# Patient Record
Sex: Female | Born: 1970 | Race: White | Hispanic: No | Smoking: Former smoker
Health system: Southern US, Community
[De-identification: ages and names within clinical notes are randomized; demographics above are authoritative.]

## PROBLEM LIST (undated history)

## (undated) DIAGNOSIS — Q0700 Arnold-Chiari syndrome without spina bifida or hydrocephalus: Secondary | ICD-10-CM

## (undated) DIAGNOSIS — M797 Fibromyalgia: Secondary | ICD-10-CM

## (undated) DIAGNOSIS — E348 Other specified endocrine disorders: Secondary | ICD-10-CM

## (undated) HISTORY — DX: Fibromyalgia: M79.7

## (undated) HISTORY — PX: INTRAUTERINE DEVICE INSERTION: SHX323

## (undated) HISTORY — DX: Other specified endocrine disorders: E34.8

## (undated) HISTORY — DX: Arnold-Chiari syndrome without spina bifida or hydrocephalus: Q07.00

---

## 1997-09-16 ENCOUNTER — Other Ambulatory Visit: Admission: RE | Admit: 1997-09-16 | Discharge: 1997-09-16 | Payer: Self-pay | Admitting: *Deleted

## 1998-01-14 DIAGNOSIS — Q0700 Arnold-Chiari syndrome without spina bifida or hydrocephalus: Secondary | ICD-10-CM

## 1998-01-14 HISTORY — DX: Arnold-Chiari syndrome without spina bifida or hydrocephalus: Q07.00

## 1998-09-01 ENCOUNTER — Encounter (HOSPITAL_COMMUNITY): Admission: RE | Admit: 1998-09-01 | Discharge: 1998-11-30 | Payer: Self-pay | Admitting: Neurology

## 1998-12-04 ENCOUNTER — Encounter: Admission: RE | Admit: 1998-12-04 | Discharge: 1998-12-04 | Payer: Self-pay | Admitting: Family Medicine

## 1998-12-04 ENCOUNTER — Encounter: Payer: Self-pay | Admitting: Family Medicine

## 1998-12-15 ENCOUNTER — Encounter: Admission: RE | Admit: 1998-12-15 | Discharge: 1998-12-15 | Payer: Self-pay | Admitting: Family Medicine

## 1998-12-15 ENCOUNTER — Encounter: Payer: Self-pay | Admitting: Family Medicine

## 1999-01-15 DIAGNOSIS — E348 Other specified endocrine disorders: Secondary | ICD-10-CM

## 1999-01-15 HISTORY — DX: Other specified endocrine disorders: E34.8

## 1999-08-07 ENCOUNTER — Encounter: Payer: Self-pay | Admitting: Family Medicine

## 1999-08-07 ENCOUNTER — Encounter: Admission: RE | Admit: 1999-08-07 | Discharge: 1999-08-07 | Payer: Self-pay | Admitting: Family Medicine

## 1999-12-21 ENCOUNTER — Emergency Department (HOSPITAL_COMMUNITY): Admission: EM | Admit: 1999-12-21 | Discharge: 1999-12-21 | Payer: Self-pay

## 2000-02-07 ENCOUNTER — Other Ambulatory Visit: Admission: RE | Admit: 2000-02-07 | Discharge: 2000-02-07 | Payer: Self-pay | Admitting: *Deleted

## 2001-03-17 ENCOUNTER — Encounter: Admission: RE | Admit: 2001-03-17 | Discharge: 2001-03-17 | Payer: Self-pay | Admitting: Family Medicine

## 2001-03-17 ENCOUNTER — Encounter: Payer: Self-pay | Admitting: Family Medicine

## 2001-04-02 ENCOUNTER — Encounter: Payer: Self-pay | Admitting: Family Medicine

## 2001-04-02 ENCOUNTER — Encounter (INDEPENDENT_AMBULATORY_CARE_PROVIDER_SITE_OTHER): Payer: Self-pay | Admitting: *Deleted

## 2001-04-02 ENCOUNTER — Encounter: Admission: RE | Admit: 2001-04-02 | Discharge: 2001-04-02 | Payer: Self-pay | Admitting: Family Medicine

## 2001-04-13 ENCOUNTER — Encounter: Admission: RE | Admit: 2001-04-13 | Discharge: 2001-06-05 | Payer: Self-pay | Admitting: Family Medicine

## 2003-07-30 ENCOUNTER — Encounter: Payer: Self-pay | Admitting: Internal Medicine

## 2003-09-07 ENCOUNTER — Other Ambulatory Visit: Admission: RE | Admit: 2003-09-07 | Discharge: 2003-09-07 | Payer: Self-pay | Admitting: Obstetrics and Gynecology

## 2003-10-15 HISTORY — PX: CHOLECYSTECTOMY, LAPAROSCOPIC: SHX56

## 2003-10-28 ENCOUNTER — Ambulatory Visit (HOSPITAL_COMMUNITY): Admission: RE | Admit: 2003-10-28 | Discharge: 2003-10-28 | Payer: Self-pay | Admitting: Surgery

## 2003-10-28 ENCOUNTER — Encounter (INDEPENDENT_AMBULATORY_CARE_PROVIDER_SITE_OTHER): Payer: Self-pay | Admitting: *Deleted

## 2003-11-16 ENCOUNTER — Encounter (INDEPENDENT_AMBULATORY_CARE_PROVIDER_SITE_OTHER): Payer: Self-pay | Admitting: *Deleted

## 2003-11-16 ENCOUNTER — Ambulatory Visit (HOSPITAL_COMMUNITY): Admission: RE | Admit: 2003-11-16 | Discharge: 2003-11-16 | Payer: Self-pay | Admitting: Gastroenterology

## 2004-09-18 ENCOUNTER — Other Ambulatory Visit: Admission: RE | Admit: 2004-09-18 | Discharge: 2004-09-18 | Payer: Self-pay | Admitting: Obstetrics and Gynecology

## 2005-01-30 ENCOUNTER — Encounter (INDEPENDENT_AMBULATORY_CARE_PROVIDER_SITE_OTHER): Payer: Self-pay | Admitting: *Deleted

## 2005-01-30 ENCOUNTER — Encounter: Admission: RE | Admit: 2005-01-30 | Discharge: 2005-01-30 | Payer: Self-pay | Admitting: Family Medicine

## 2006-01-14 DIAGNOSIS — M797 Fibromyalgia: Secondary | ICD-10-CM

## 2006-01-14 HISTORY — DX: Fibromyalgia: M79.7

## 2007-01-16 ENCOUNTER — Ambulatory Visit (HOSPITAL_COMMUNITY): Admission: RE | Admit: 2007-01-16 | Discharge: 2007-01-16 | Payer: Self-pay | Admitting: Psychiatry

## 2007-04-09 ENCOUNTER — Other Ambulatory Visit: Admission: RE | Admit: 2007-04-09 | Discharge: 2007-04-09 | Payer: Self-pay | Admitting: Obstetrics and Gynecology

## 2008-03-01 ENCOUNTER — Encounter: Payer: Self-pay | Admitting: Internal Medicine

## 2008-03-09 ENCOUNTER — Encounter: Payer: Self-pay | Admitting: Internal Medicine

## 2008-04-12 ENCOUNTER — Other Ambulatory Visit: Admission: RE | Admit: 2008-04-12 | Discharge: 2008-04-12 | Payer: Self-pay | Admitting: Obstetrics & Gynecology

## 2008-07-25 DIAGNOSIS — R109 Unspecified abdominal pain: Secondary | ICD-10-CM | POA: Insufficient documentation

## 2008-07-25 DIAGNOSIS — Z8719 Personal history of other diseases of the digestive system: Secondary | ICD-10-CM

## 2008-07-25 DIAGNOSIS — R197 Diarrhea, unspecified: Secondary | ICD-10-CM | POA: Insufficient documentation

## 2008-07-25 DIAGNOSIS — R110 Nausea: Secondary | ICD-10-CM | POA: Insufficient documentation

## 2008-07-25 DIAGNOSIS — K649 Unspecified hemorrhoids: Secondary | ICD-10-CM | POA: Insufficient documentation

## 2008-07-25 DIAGNOSIS — M2550 Pain in unspecified joint: Secondary | ICD-10-CM

## 2008-07-25 DIAGNOSIS — E0590 Thyrotoxicosis, unspecified without thyrotoxic crisis or storm: Secondary | ICD-10-CM | POA: Insufficient documentation

## 2008-07-28 ENCOUNTER — Ambulatory Visit: Payer: Self-pay | Admitting: Internal Medicine

## 2008-07-28 DIAGNOSIS — Q054 Unspecified spina bifida with hydrocephalus: Secondary | ICD-10-CM | POA: Insufficient documentation

## 2008-07-28 DIAGNOSIS — R51 Headache: Secondary | ICD-10-CM

## 2008-07-28 DIAGNOSIS — R519 Headache, unspecified: Secondary | ICD-10-CM | POA: Insufficient documentation

## 2008-07-29 ENCOUNTER — Ambulatory Visit: Payer: Self-pay | Admitting: Internal Medicine

## 2008-07-29 ENCOUNTER — Encounter: Payer: Self-pay | Admitting: Internal Medicine

## 2008-08-01 LAB — CONVERTED CEMR LAB: Tissue Transglutaminase Ab, IgA: 0.1 units (ref <7)

## 2008-08-02 ENCOUNTER — Encounter: Payer: Self-pay | Admitting: Internal Medicine

## 2008-08-03 ENCOUNTER — Encounter: Payer: Self-pay | Admitting: Internal Medicine

## 2008-08-05 ENCOUNTER — Telehealth: Payer: Self-pay | Admitting: Internal Medicine

## 2008-09-05 ENCOUNTER — Ambulatory Visit: Payer: Self-pay | Admitting: Internal Medicine

## 2009-06-08 ENCOUNTER — Telehealth (INDEPENDENT_AMBULATORY_CARE_PROVIDER_SITE_OTHER): Payer: Self-pay | Admitting: *Deleted

## 2009-11-14 HISTORY — PX: KNEE ARTHROSCOPY: SHX127

## 2010-02-04 ENCOUNTER — Encounter: Payer: Self-pay | Admitting: Family Medicine

## 2010-02-13 NOTE — Progress Notes (Signed)
  Phone Note Other Incoming   Request: Send information Summary of Call: Request for records received from  [facility] [place]. Attorneys. Request forwarded to [employer].

## 2010-04-24 ENCOUNTER — Other Ambulatory Visit: Payer: Self-pay | Admitting: Internal Medicine

## 2010-04-24 MED ORDER — CILIDINIUM-CHLORDIAZEPOXIDE 2.5-5 MG PO CAPS: 1.0000 | ORAL_CAPSULE | Freq: Three times a day (TID) | ORAL | Status: AC

## 2010-04-24 NOTE — Telephone Encounter (Signed)
OK, refill and OV

## 2010-04-24 NOTE — Telephone Encounter (Signed)
Dr Juanda Chance, patient last seen 08/2008. She has not gotten librax filled since 11/2008. Do you want me to give a refill and have her come for f/u office visit next available?

## 2010-06-01 NOTE — Op Note (Signed)
NAME:  Isabel Thomas, Isabel Thomas NO.:  [id number]   MEDICAL RECORD NO.:  [id number]          PATIENT TYPE:  AMB   LOCATION:  ENDO                         FACILITY:  [facility]   PHYSICIAN:  Graylin Shiver, M.D.   DATE OF BIRTH:  08/13/1970   DATE OF PROCEDURE:  11/16/2003  DATE OF DISCHARGE:                                 OPERATIVE REPORT   PROCEDURE:  Endoscopic retrograde cholangiogram with sphincterotomy and  stone extraction from the common bile duct.   INDICATIONS FOR PROCEDURE:  Patient is a 40 year old female status post  laparoscopic cholecystectomy recently.  She had an intraoperative  cholangiogram which, when reviewed with the radiologist, was suggestive of  common bile duct stones.   The procedure was explained in detail along with potential risks of  bleeding, infection, and pancreatitis.   PREMEDICATION:  Fentanyl 125 mcg IV, Versed 12 mg IV.   PROCEDURE:  With the patient in the left lateral position, the Olympus  lateral-viewing duodenoscope was inserted into the oropharynx and passed  into the esophagus.  It was advanced down the esophagus, then into the  stomach, then into the duodenum.  The patient was placed on her abdomen.  The papilla of Vater was localized.  It looked normal.  Selective  cannulation of the common bile duct was achieved using a guidewire and  sphincterotome.  Contrast was injected into the biliary tree.  There were a  couple of filling defects noted in the common bile duct, consistent with  stones.  Radiographs were obtained.  The sphincterotome was then properly  localized, and a sphincterotomy was performed.  There was no bleeding.  The  sphincterotome was then removed, leaving the guidewire in place in the  biliary tree.  An 8.5 mm balloon was inserted over the guidewire and  advanced over the common bile duct up into the common hepatic duct area.  The duct was swept.  Two small-to-moderate-sized stones were extracted from  the  common bile duct.  After this, an occlusion cholangiogram was done with  the balloon inflated.  Further inspection of the biliary tree did not reveal  any further stones.  The instruments were removed.  She tolerated the  procedure well without complications.  She was returned to the endoscopy  unit for observation and then discharged.   IMPRESSION:  Choledocholithiasis, status post endoscopic retrograde  cholangiopancreatography, sphincterotomy, and stone extraction from the  common bile duct.      SFG/MEDQ  D:  11/16/2003  T:  11/16/2003  Job:  [id number]   cc:   Sandria Bales. Ezzard Standing, M.D.  [address]  [place]  [place] [address]  Fax: [phone]   Marjory Lies, M.D.  [address]  [place]  [place] [address]  Fax: [phone]

## 2010-06-01 NOTE — Op Note (Signed)
NAME:  Isabel Thomas, Isabel Thomas NO.:  [id number]   MEDICAL RECORD NO.:  [id number]          PATIENT TYPE:  AMB   LOCATION:  DAY                          FACILITY:  [facility]   PHYSICIAN:  Sandria Bales. Ezzard Standing, M.D.  DATE OF BIRTH:  10/30/1970   DATE OF PROCEDURE:  10/28/2003  DATE OF DISCHARGE:                                 OPERATIVE REPORT   PREOPERATIVE DIAGNOSIS:  Chronic cholecystitis, cholelithiasis.   POSTOPERATIVE DIAGNOSIS:  Chronic cholecystitis, cholelithiasis.   PROCEDURE:  Laparoscopic cholecystectomy with intraoperative cholangiogram.   SURGEON:  Dr. Ezzard Standing.   FIRST ASSISTANT:  No first assistant.   ANESTHESIA:  General endotracheal.   INDICATIONS FOR PROCEDURE:  [name] is a 40 year old white female who  has had symptomatic cholelithiasis. Has an ultrasound showing multiple  gallstones and now comes for attempted laparoscopic cholecystectomy. The  indications and potential complications explained to the patient. Potential  complications include but limited to bleeding, infection, bile duct injury,  and open surgery.   OPERATIVE NOTE:  Under general anesthesia, she was given 1 g of Ancef  initial to this procedure. Abdomen was prepped with Betadine solution and  sterilely draped.   An infraumbilical incision was made with sharp dissection and carried down  to the abdominal cavity. I used Applied trocars. I used a Hasson trocar at  the umbilicus. I insufflated the abdomen. The right and left lobe of the  liver were unremarkable. Anterior wall of the stomach was unremarkable. The  bowel was pretty much covered with omentum, but there was no bowel injury or  nodularity within the abdominal cavity.   I then placed a 10-mm xyphoid trocar, a 5-mm right mid subcostal, and a 5-mm  lateral subcostal trocar. The gallbladder was grasped, rotated cephalad. The  patient had a fair amount of adhesions and scar tissue around her  gallbladder. Cystic duct junction  identified and anterior/posterior branch  of the cystic artery and clipped both of these twice. I then identified the  cystic duct, placed a clip on the gallbladder side of the cystic duct and  shot an intraoperative cholangiogram.   Intraoperative cholangiogram was obtained using a cut-off Taut catheter  inserted through a 14-gauge Gelco catheter. The Gelco catheter was sutured  with an Endoclip, and then I used about 6 cc of half-strength Hypaque  solution and injected this into the common bile duct which emptied into the  duodenum without any evidence of obstruction or mass. The contrast refluxed  both limbs of the common bile duct without any blockage or obstruction.   It was felt to be a normal intraoperative cholangiogram. The Taut catheter  was then removed. The cystic duct was triply clipped and divided. I then  sharply dissected the gallbladder from the gallbladder bed. She had 1 or 2  more vessels posterior that were either bovied or clipped. After removing  the entire gallbladder, I placed a EndoCatch bag. I inspected the  gallbladder bed. There was no bleeding or bile leak at either the triangle  of Calot or at the gallbladder bed. The gallbladder was then removed through  the umbilicus and sent to pathology.  I did give a couple of stones to the  patient. The trocars were then removed. The umbilical port was closed with a  0 Vicryl suture. There was no bleeding any trocar site. Skin entry site was  closed with a 5-0 Monocryl suture. Each wound was painted with Benzoin and  steri stripped.   The patient tolerated the procedure well and was transported to the recovery  room in good condition. If she does well, she may go home today.      Davi   DHN/MEDQ  D:  10/28/2003  T:  10/28/2003  Job:  [id number]

## 2011-04-18 LAB — HM PAP SMEAR: HM Pap smear: NEGATIVE

## 2012-01-30 ENCOUNTER — Other Ambulatory Visit: Payer: Self-pay | Admitting: Obstetrics and Gynecology

## 2012-01-30 DIAGNOSIS — Z1231 Encounter for screening mammogram for malignant neoplasm of breast: Secondary | ICD-10-CM

## 2012-02-25 ENCOUNTER — Ambulatory Visit: Payer: Self-pay

## 2012-03-17 ENCOUNTER — Ambulatory Visit
Admission: RE | Admit: 2012-03-17 | Discharge: 2012-03-17 | Disposition: A | Discharge status: Home / Self Care | Payer: BC Managed Care – PPO | Source: Ambulatory Visit | Attending: Obstetrics and Gynecology | Admitting: Obstetrics and Gynecology

## 2012-03-17 ENCOUNTER — Other Ambulatory Visit: Payer: Self-pay | Admitting: Obstetrics and Gynecology

## 2012-03-17 DIAGNOSIS — Z1231 Encounter for screening mammogram for malignant neoplasm of breast: Secondary | ICD-10-CM

## 2012-04-20 ENCOUNTER — Ambulatory Visit (INDEPENDENT_AMBULATORY_CARE_PROVIDER_SITE_OTHER): Payer: BC Managed Care – PPO | Admitting: Nurse Practitioner

## 2012-04-20 ENCOUNTER — Encounter: Payer: Self-pay | Admitting: Nurse Practitioner

## 2012-04-20 VITALS — BP 98/68 | HR 70 | Resp 16 | Ht 59.5 in | Wt 109.0 lb

## 2012-04-20 DIAGNOSIS — Z01419 Encounter for gynecological examination (general) (routine) without abnormal findings: Secondary | ICD-10-CM

## 2012-04-20 DIAGNOSIS — Z0000 Encounter for general adult medical examination without abnormal findings: Secondary | ICD-10-CM

## 2012-04-20 DIAGNOSIS — N951 Menopausal and female climacteric states: Secondary | ICD-10-CM

## 2012-04-20 LAB — POCT URINALYSIS DIPSTICK
Bilirubin, UA: NEGATIVE
Glucose, UA: NEGATIVE
Nitrite, UA: NEGATIVE

## 2012-04-20 MED ORDER — DESOGESTREL-ETHINYL ESTRADIOL 0.15-0.02/0.01 MG (21/5) PO TABS: 1.0000 | ORAL_TABLET | Freq: Every day | ORAL | Status: DC

## 2012-04-20 NOTE — Progress Notes (Signed)
42 y.o. G0P0 Married Caucasian Fe here for annual exam.   Her father in law passed on Christmas day with MI.   He ws 68. Menses recent cycles are normal   Patient's last menstrual period was 03/24/2012.          Sexually active: yes  The current method of family planning is vasectomy. Exercising: no  The patient does not participate in regular exercise at present. Smoker:  Yes ( still 5 per day)  Health Maintenance: Pap:  04/18/11 normal with neg HR HPV MMG:  3/14 normal Colonoscopy:  NA BMD:   NA TDaP:  None?, will check with PCP Labs: UA and HGB   reports that she has been smoking.  She has never used smokeless tobacco. She reports that she does not drink alcohol or use illicit drugs.  Past Medical History  Diagnosis Date  . Arnold-Chiari syndrome 2000    cause of Headaches  . Fibromyalgia syndrome 2008    Past Surgical History  Procedure Laterality Date  . Knee arthroscopy Right 11/11    with lateral release  . Cholecystectomy, laparoscopic  10/05  . Intrauterine device insertion  09/13/96, removed 4/09    Current Outpatient Prescriptions  Medication Sig Dispense Refill  . acetaZOLAMIDE (DIAMOX SEQUELS) 500 MG capsule Take 500 mg by mouth 2 (two) times daily.      . Ascorbic Acid (VITAMIN C PO) Take by mouth.      . B-12, Methylcobalamin, 1000 MCG SUBL Place under the tongue.      . Biotin (BIOTIN 5000) 5 MG CAPS Take by mouth.      . calcium carbonate (OS-CAL) 600 MG TABS Take 600 mg by mouth 2 (two) times daily with a meal.      . cholecalciferol (VITAMIN D) 1000 UNITS tablet Take 1,000 Units by mouth daily.      Marland Kitchen desogestrel-ethinyl estradiol (KARIVA,AZURETTE,MIRCETTE) 0.15-0.02/0.01 MG (21/5) tablet Take 1 tablet by mouth daily.      . folic acid (FOLVITE) 400 MCG tablet Take 400 mcg by mouth daily.      Marland Kitchen glucosamine-chondroitin 500-400 MG tablet Take 1 tablet by mouth 3 (three) times daily.      Marland Kitchen levothyroxine (SYNTHROID, LEVOTHROID) 75 MCG tablet Take 100 mcg by  mouth.       Marland Kitchen oxycodone (OXY-IR) 5 MG capsule Take 5 mg by mouth every 4 (four) hours as needed.      . potassium citrate (UROCIT-K) 10 MEQ (1080 MG) SR tablet Take 10 mEq by mouth 3 (three) times daily with meals.      . tapentadol (NUCYNTA) 50 MG TABS Take 100 mg by mouth.       . temazepam (RESTORIL) 30 MG capsule Take 30 mg by mouth at bedtime as needed for sleep.      . vitamin E 400 UNIT capsule Take 400 Units by mouth daily.       No current facility-administered medications for this visit.    Family History  Problem Relation Age of Onset  . Cancer Maternal Grandmother     ROS:  Pertinent items are noted in HPI.  Otherwise, a comprehensive ROS was negative.  Exam:   BP 98/68  Pulse 70  Resp 16  Ht 4' 11.5" (1.511 m)  Wt 109 lb (49.442 kg)  BMI 21.66 kg/m2  LMP 03/24/2012   Height: 4' 11.5" (151.1 cm)  Ht Readings from Last 3 Encounters:  04/20/12 4' 11.5" (1.511 m)  09/05/08 4' 2.75" (1.289 m)  07/28/08 4' 2.75" (1.289 m)    General appearance: alert, cooperative and appears stated age Head: Normocephalic, without obvious abnormality, atraumatic Neck: no adenopathy, supple, symmetrical, trachea midline and thyroid non-palpable Lungs: clear to auscultation bilaterally Breasts: normal appearance, no masses or tenderness Heart: regular rate and rhythm Abdomen: soft, non-tender; bowel sounds normal; no masses,  no organomegaly Extremities: extremities normal, atraumatic, no cyanosis or edema Skin: Skin color, texture, turgor normal. No rashes or lesions Lymph nodes: Cervical, supraclavicular, and axillary nodes normal. No abnormal inguinal nodes palpated Neurologic: Grossly normal   Pelvic: External genitalia:  no lesions              Urethra:  normal appearing urethra with no masses, tenderness or lesions              Bartholin's and Skene's: normal                 Vagina: normal appearing vagina with normal color and discharge, no lesions              Cervix:  anteverted              Pap taken: no Bimanual Exam:  Uterus:  normal size, contour, position, consistency, mobility, non-tender              Adnexa: no mass, fullness, tenderness               Rectovaginal: Confirms               Anus:  normal sphincter tone, no lesions  A:  Well Woman with normal exam  Will DC OCP since still smoking  Recent loss of Father in Law      P:   Mammogram due 03/2013  Will keep menses record  Will discuss with patient the need to change to POP  pap smear as per guidelines  return annually or prn  An After Visit Summary was printed and given to the patient.  See PC note as follow up

## 2012-04-20 NOTE — Patient Instructions (Addendum)

## 2012-04-21 ENCOUNTER — Telehealth: Payer: Self-pay | Admitting: Nurse Practitioner

## 2012-04-21 MED ORDER — NORETHINDRONE 0.35 MG PO TABS: 1.0000 | ORAL_TABLET | Freq: Every day | ORAL | Status: DC

## 2012-04-21 NOTE — Telephone Encounter (Signed)
Left message for her to call back

## 2012-04-21 NOTE — Telephone Encounter (Signed)
RETURNING YOUR CALL

## 2012-04-21 NOTE — Telephone Encounter (Signed)
Spoke to patient about need to change Rx to a POP medication instead of OCP secondary to her continues smoking.  She understands and hopes that she will do as well.  We are trying to keep her from going through early menopause as is her [facility].  We also hope to help her with PMS symptoms.   Rx sent to pharmacy.

## 2012-04-22 NOTE — Progress Notes (Signed)
Encounter reviewed by Dr. Jannessa Ogden Silva.  

## 2012-06-17 ENCOUNTER — Telehealth: Payer: Self-pay | Admitting: Nurse Practitioner

## 2012-06-17 NOTE — Telephone Encounter (Signed)
Pt would like to speak to nurse regarding her birth control pills. Says she is about to  Run out of the Rx and will need another refill but patty was going to switch her Rx.

## 2012-06-17 NOTE — Telephone Encounter (Signed)
Patient has three days left of OCP and has new RX for POP on file at pharm.  (see Patty's previous phone message) patient states she understands why she has to make the change but she wonders if the benefit to her is worth the change?  She is NOT on pills for contraception, she is on them for prevention of early menopause and management of PMS.  Should she take POP or would it be ok to stop and be off pills completely? Does she NEED to be on POP? Please advise.

## 2012-06-17 NOTE — Telephone Encounter (Signed)
Route call back to triage.

## 2012-06-18 NOTE — Telephone Encounter (Signed)
patient was noted at [facility] that she continues still smoke 5-6 cigarettes a day, therefore was changed to POP.  She is aware of rationale and I do agree that hormonal control to help her through perimenopause is perhaps better achieved on OCP rather than POP.  But a lot of women still do very well on POP.  I would advise continued treatment on POP and give it some time to see if she does well.  If not we can then just discontinue after a trail of 3-6 months.  The other thing is for her stop smoking  For at least a year if we need to restart OCP.

## 2012-06-23 NOTE — Telephone Encounter (Signed)
Pt has already picked up Rx for POP and has started taking it, pt said she will take it for a couple months, not sure what Benefit this will have other than cycle control. But if she stops smoking will she be able to return to taking OCP? Please advise

## 2012-06-23 NOTE — Telephone Encounter (Signed)
LMTCB 06/23/12 cm

## 2012-06-23 NOTE — Telephone Encounter (Signed)
LMTCB 06/22/12

## 2012-06-24 NOTE — Telephone Encounter (Signed)
After about a year of no smoking will be allowed to restart OCP if no other risk factors.

## 2012-06-25 NOTE — Telephone Encounter (Signed)
I explained to Pt. That Clayborne Dana said after 1 year of no smoking if no other risk factors she will be allowed to restart OCP. Pt was ok with this.

## 2013-02-23 ENCOUNTER — Emergency Department (HOSPITAL_COMMUNITY): Payer: BC Managed Care – PPO

## 2013-02-23 ENCOUNTER — Emergency Department (HOSPITAL_COMMUNITY)
Admission: EM | Admit: 2013-02-23 | Discharge: 2013-02-23 | Disposition: A | Discharge status: Home / Self Care | Payer: BC Managed Care – PPO | Attending: Emergency Medicine | Admitting: Emergency Medicine

## 2013-02-23 ENCOUNTER — Encounter (HOSPITAL_COMMUNITY): Payer: Self-pay | Admitting: Emergency Medicine

## 2013-02-23 DIAGNOSIS — Z79899 Other long term (current) drug therapy: Secondary | ICD-10-CM | POA: Insufficient documentation

## 2013-02-23 DIAGNOSIS — F17200 Nicotine dependence, unspecified, uncomplicated: Secondary | ICD-10-CM | POA: Insufficient documentation

## 2013-02-23 DIAGNOSIS — N200 Calculus of kidney: Secondary | ICD-10-CM

## 2013-02-23 DIAGNOSIS — IMO0002 Reserved for concepts with insufficient information to code with codable children: Secondary | ICD-10-CM | POA: Insufficient documentation

## 2013-02-23 DIAGNOSIS — R112 Nausea with vomiting, unspecified: Secondary | ICD-10-CM | POA: Insufficient documentation

## 2013-02-23 DIAGNOSIS — R109 Unspecified abdominal pain: Secondary | ICD-10-CM

## 2013-02-23 DIAGNOSIS — Z3202 Encounter for pregnancy test, result negative: Secondary | ICD-10-CM | POA: Insufficient documentation

## 2013-02-23 LAB — URINALYSIS, ROUTINE W REFLEX MICROSCOPIC
Bilirubin Urine: NEGATIVE
GLUCOSE, UA: NEGATIVE mg/dL
HGB URINE DIPSTICK: NEGATIVE
Ketones, ur: >80 mg/dL — AB
Leukocytes, UA: NEGATIVE
Nitrite: NEGATIVE
PROTEIN: NEGATIVE mg/dL
Specific Gravity, Urine: 1.018 (ref 1.005–1.030)
Urobilinogen, UA: 0.2 mg/dL (ref 0.0–1.0)
pH: 7 (ref 5.0–8.0)

## 2013-02-23 LAB — CBC WITH DIFFERENTIAL/PLATELET
BASOS PCT: 0 % (ref 0–1)
Basophils Absolute: 0 10*3/uL (ref 0.0–0.1)
EOS ABS: 0.1 10*3/uL (ref 0.0–0.7)
EOS PCT: 0 % (ref 0–5)
HEMATOCRIT: 39.1 % (ref 36.0–46.0)
HEMOGLOBIN: 13.3 g/dL (ref 12.0–15.0)
LYMPHS PCT: 13 % (ref 12–46)
Lymphs Abs: 1.8 10*3/uL (ref 0.7–4.0)
MCH: 30.6 pg (ref 26.0–34.0)
MCHC: 34 g/dL (ref 30.0–36.0)
MCV: 89.9 fL (ref 78.0–100.0)
Monocytes Absolute: 0.6 10*3/uL (ref 0.1–1.0)
Monocytes Relative: 4 % (ref 3–12)
NEUTROS PCT: 82 % — AB (ref 43–77)
Neutro Abs: 11.5 10*3/uL — ABNORMAL HIGH (ref 1.7–7.7)
PLATELETS: 193 10*3/uL (ref 150–400)
RBC: 4.35 MIL/uL (ref 3.87–5.11)
RDW: 13.6 % (ref 11.5–15.5)
WBC: 14 10*3/uL — ABNORMAL HIGH (ref 4.0–10.5)

## 2013-02-23 LAB — PREGNANCY, URINE: Preg Test, Ur: NEGATIVE

## 2013-02-23 LAB — POCT PREGNANCY, URINE: Preg Test, Ur: NEGATIVE

## 2013-02-23 LAB — URINE MICROSCOPIC-ADD ON

## 2013-02-23 LAB — COMPREHENSIVE METABOLIC PANEL
ALT: 21 U/L (ref 0–35)
AST: 21 U/L (ref 0–37)
Albumin: 4.2 g/dL (ref 3.5–5.2)
Alkaline Phosphatase: 55 U/L (ref 39–117)
BUN: 15 mg/dL (ref 6–23)
CO2: 15 mEq/L — ABNORMAL LOW (ref 19–32)
CREATININE: 0.82 mg/dL (ref 0.50–1.10)
Calcium: 8.8 mg/dL (ref 8.4–10.5)
Chloride: 109 mEq/L (ref 96–112)
GFR calc Af Amer: >90 mL/min (ref >90)
GFR, EST NON AFRICAN AMERICAN: 87 mL/min — AB (ref >90)
GLUCOSE: 149 mg/dL — AB (ref 70–99)
POTASSIUM: 3.5 meq/L — AB (ref 3.7–5.3)
Sodium: 140 mEq/L (ref 137–147)
TOTAL PROTEIN: 6.4 g/dL (ref 6.0–8.3)
Total Bilirubin: 0.3 mg/dL (ref 0.3–1.2)

## 2013-02-23 LAB — MAGNESIUM: Magnesium: 2 mg/dL (ref 1.5–2.5)

## 2013-02-23 LAB — LIPASE, BLOOD: Lipase: 50 U/L (ref 11–59)

## 2013-02-23 MED ORDER — KETOROLAC TROMETHAMINE 30 MG/ML IJ SOLN
30.0000 mg | Freq: Once | INTRAMUSCULAR | Status: AC
  Administered 2013-02-23: 30 mg via INTRAVENOUS
  Filled 2013-02-23: qty 1

## 2013-02-23 MED ORDER — ONDANSETRON 4 MG PO TBDP: ORAL_TABLET | ORAL | Status: DC

## 2013-02-23 MED ORDER — SODIUM CHLORIDE 0.9 % IV BOLUS (SEPSIS)
1000.0000 mL | Freq: Once | INTRAVENOUS | Status: AC
  Administered 2013-02-23: 1000 mL via INTRAVENOUS

## 2013-02-23 MED ORDER — HYDROMORPHONE HCL PF 1 MG/ML IJ SOLN
0.5000 mg | Freq: Once | INTRAMUSCULAR | Status: AC
  Administered 2013-02-23: 0.5 mg via INTRAVENOUS
  Filled 2013-02-23: qty 1

## 2013-02-23 MED ORDER — POTASSIUM CHLORIDE CRYS ER 20 MEQ PO TBCR
40.0000 meq | EXTENDED_RELEASE_TABLET | Freq: Once | ORAL | Status: AC
  Administered 2013-02-23: 40 meq via ORAL
  Filled 2013-02-23: qty 2

## 2013-02-23 MED ORDER — ONDANSETRON HCL 4 MG/2ML IJ SOLN
4.0000 mg | Freq: Once | INTRAMUSCULAR | Status: AC
  Administered 2013-02-23: 4 mg via INTRAVENOUS
  Filled 2013-02-23: qty 2

## 2013-02-23 NOTE — ED Provider Notes (Signed)
CSN: [id number]     Arrival date & time 02/23/13  1134 History   First MD Initiated Contact with Patient 02/23/13 1150     Chief Complaint  Patient presents with  . Flank Pain     (Consider location/radiation/quality/duration/timing/severity/associated sxs/prior Treatment) HPI Comments: 43 yo female with hyperthyroid, Roselie Awkward Chiari presents with right flank pain.  Pt had sudden onset right back pain, mild radiation along back, vomiting non bloody with pain.  No known kidney stones.  Pt on diamox for AChiari hx.  Intermittent, ache/ sharp.  No hx of similar, no injury.    Patient is a 43 y.o. female presenting with flank pain. The history is provided by the patient.  Flank Pain Pertinent negatives include no chest pain, no abdominal pain, no headaches and no shortness of breath.    Past Medical History  Diagnosis Date  . Arnold-Chiari syndrome 2000    cause of Headaches  . Fibromyalgia syndrome 2008   Past Surgical History  Procedure Laterality Date  . Knee arthroscopy Right 11/11    with lateral release  . Cholecystectomy, laparoscopic  10/05  . Intrauterine device insertion  09/13/96, removed 4/09   Family History  Problem Relation Age of Onset  . Cancer Maternal Grandmother 56    colon  . Cancer Mother 78    thyroid  . Thyroid disease Mother   . Thyroid disease Father   . Colon cancer Maternal Uncle 67   History  Substance Use Topics  . Smoking status: Current Every Day Smoker  . Smokeless tobacco: Never Used  . Alcohol Use: No   OB History   Grav Para Term Preterm Abortions TAB SAB Ect Mult Living   0              Review of Systems  Constitutional: Positive for appetite change. Negative for fever and chills.  HENT: Negative for congestion.   Eyes: Negative for visual disturbance.  Respiratory: Negative for shortness of breath.   Cardiovascular: Negative for chest pain.  Gastrointestinal: Positive for nausea and vomiting. Negative for abdominal pain.   Genitourinary: Positive for flank pain. Negative for dysuria.  Musculoskeletal: Negative for back pain, neck pain and neck stiffness.  Skin: Negative for rash.  Neurological: Negative for light-headedness and headaches.      Allergies  Flagyl and Lyrica  Home Medications   Current Outpatient Rx  Name  Route  Sig  Dispense  Refill  . acetaZOLAMIDE (DIAMOX SEQUELS) 500 MG capsule   Oral   Take 1,500 mg by mouth 2 (two) times daily.          . Ascorbic Acid (VITAMIN C PO)   Oral   Take 1 tablet by mouth daily.          . Biotin (BIOTIN 5000) 5 MG CAPS   Oral   Take 5 mg by mouth daily.          . calcium carbonate (OS-CAL) 600 MG TABS   Oral   Take 600 mg by mouth 2 (two) times daily with a meal.         . cholecalciferol (VITAMIN D) 1000 UNITS tablet   Oral   Take 1,000 Units by mouth daily.         . fluticasone (FLONASE) 50 MCG/ACT nasal spray   Nasal   Place 2 sprays into the nose daily as needed for allergies.          . folic acid (FOLVITE) [id number] MCG tablet  Oral   Take 400 mcg by mouth daily.         . hydrocortisone (ANUSOL-HC) 25 MG suppository   Rectal   Place 25 mg rectally 2 (two) times daily as needed for hemorrhoids or itching.          Marland Kitchen ibuprofen (ADVIL,MOTRIN) 200 MG tablet   Oral   Take 200-400 mg by mouth every 6 (six) hours as needed for fever, headache or moderate pain.         Marland Kitchen levothyroxine (SYNTHROID, LEVOTHROID) 88 MCG tablet   Oral   Take 88 mcg by mouth daily before breakfast.         . norethindrone (MICRONOR,CAMILA,ERRIN) 0.35 MG tablet   Oral   Take 1 tablet (0.35 mg total) by mouth daily.   3 Package   3   . oxycodone (OXY-IR) 5 MG capsule   Oral   Take 10-15 mg by mouth every 8 (eight) hours as needed for pain.          . potassium chloride (MICRO-K) 10 MEQ CR capsule   Oral   Take 10 mEq by mouth 2 (two) times daily.         . Tapentadol HCl (NUCYNTA) 100 MG TABS   Oral   Take 100 mg by  mouth 2 (two) times daily.         . temazepam (RESTORIL) 15 MG capsule   Oral   Take 15-30 mg by mouth at bedtime as needed for sleep.         . vitamin E 400 UNIT capsule   Oral   Take 400 Units by mouth daily.          BP 134/76  Pulse 52  Temp(Src) 97.6 F (36.4 C) (Oral)  Resp 22  SpO2 100%  LMP 01/23/2013 Physical Exam  Nursing note and vitals reviewed. Constitutional: She is oriented to person, place, and time. She appears well-developed and well-nourished.  HENT:  Head: Normocephalic and atraumatic.  Mild dry mm  Eyes: Conjunctivae are normal. Right eye exhibits no discharge. Left eye exhibits no discharge.  Neck: Normal range of motion. Neck supple. No tracheal deviation present.  Cardiovascular: Normal rate and regular rhythm.   Pulmonary/Chest: Effort normal and breath sounds normal.  Abdominal: Soft. She exhibits no distension. There is tenderness (epig and right flank/ RUQ). There is no guarding.  Musculoskeletal: She exhibits no edema.  Neurological: She is alert and oriented to person, place, and time.  Skin: Skin is warm. No rash noted.  Psychiatric: She has a normal mood and affect.    ED Course  Procedures (including critical care time) Emergency Focused Ultrasound Exam Limited retroperitoneal ultrasound of kidneys  Performed and interpreted by Dr. Reather Converse Indication: flank pain Focused abdominal ultrasound with both kidneys imaged in transverse and longitudinal planes in real-time. Interpretation: Mild right hydronephrosis visualized.  No stones or cysts visualized  Images archived electronically  Labs Review Labs Reviewed  CBC WITH DIFFERENTIAL - Abnormal; Notable for the following:    WBC 14.0 (*)    Neutrophils Relative % 82 (*)    Neutro Abs 11.5 (*)    All other components within normal limits  COMPREHENSIVE METABOLIC PANEL - Abnormal; Notable for the following:    Potassium 3.5 (*)    CO2 15 (*)    Glucose, Bld 149 (*)    GFR calc  non Af Amer 87 (*)    All other components within normal limits  URINALYSIS, ROUTINE  W REFLEX MICROSCOPIC - Abnormal; Notable for the following:    APPearance TURBID (*)    Ketones, ur >80 (*)    All other components within normal limits  URINE MICROSCOPIC-ADD ON - Abnormal; Notable for the following:    Bacteria, UA FEW (*)    All other components within normal limits  LIPASE, BLOOD  PREGNANCY, URINE  MAGNESIUM  POCT PREGNANCY, URINE   Imaging Review Ct Abdomen Pelvis Wo Contrast  02/23/2013   CLINICAL DATA:  Low back pain and abdominal pain, worse since 8 o'clock a.m. Nausea, vomiting. No history of stones.  EXAM: CT ABDOMEN AND PELVIS WITHOUT CONTRAST  TECHNIQUE: Multidetector CT imaging of the abdomen and pelvis was performed following the standard protocol without intravenous contrast.  COMPARISON:  None.  FINDINGS: Images of the lung bases are negative.  Within the right kidney, there are numerous intrarenal calculi, measuring 2-3 mm in diameter. There is mild right hydronephrosis and right renal edema. A stone is identified within the right ureter, at the level of S1, measuring 3 mm in diameter. A small calcification measuring 3 mm in the right hemipelvis on image 66 could represent a distal ureteral stone at the ureterovesical junction. More likely, this is favored to represent a pelvic phlebolith.  The left kidney in ureter are unremarkable in appearance.  No focal abnormality identified within the liver, spleen, pancreas, or adrenal glands. The gallbladder is surgically absent. Note is made of periportal edema, of unknown significance. Has the patient had recent IV fluids?  The stomach and small bowel loops have a normal appearance. The appendix contains high attenuation material. The appendix is otherwise is normal in caliber. No periappendiceal stranding identified. Colonic loops are normal in appearance.  There is a small amount of free pelvic fluid. The uterus is present. No adnexal  mass or pelvic adenopathy identified.  IMPRESSION: 1. 3 mm right ureteral stone cava at the level of S1, causing mild hydronephrosis and right renal edema. 2. Possible distal right ureteral stone versus pelvic phlebolith. 3. Appendix with high attenuation material. However, no other evidence for acute appendicitis. 4. Periportal edema, of unknown significance. This can be seen in setting of recent intravenous fluid administration and venous congestion.   Electronically Signed   By: Shon Hale M.D.   On: 02/23/2013 15:03    EKG Interpretation   None       MDM   Final diagnoses:  None    Concern for kidney stone with HPI. Less likely appy with acute onset.  Pt does not have her gb.  Bedside US no significant hydronephrosis. Pain meds and fluids. CT ordered due to no significant hydro, mild wbc elevation and no hematuria, clinically kidney stone. CT confirmed small stone.   Sxs improved in ED.  2 L bolus in ED.  Fup outpt with urology. Filed Vitals:   02/23/13 1133  BP: 134/76  Pulse: 52  Temp: 97.6 F (36.4 C)  TempSrc: Oral  Resp: 22  SpO2: 100%      Results and differential diagnosis were discussed with the patient. Close follow up outpatient was discussed, patient comfortable with the plan.   Right kidney stone, dehydration, acute flank pain, vomiting      Mariea Clonts, MD 02/23/13 [phone]

## 2013-02-23 NOTE — ED Notes (Signed)
MD at bedside. 

## 2013-02-23 NOTE — ED Notes (Signed)
Pt states that she started having flank pain all the way across her low back, rt side worse.  States that she has thrown up 3 times.  Denies hx of kidney stones.  Takes diamox which she states makes it easier for her to get kidney stones.

## 2013-02-23 NOTE — ED Notes (Signed)
Bed: WA08 Expected date:  Expected time:  Means of arrival:  Comments: 

## 2013-02-23 NOTE — ED Notes (Addendum)
Pt may take home medication NUCYNTA ER 100MG  PER EDP ZAVITZ

## 2013-02-23 NOTE — Discharge Instructions (Signed)
If you were given medicines take as directed.  If you are on coumadin or contraceptives realize their levels and effectiveness is altered by many different medicines.  If you have any reaction (rash, tongues swelling, other) to the medicines stop taking and see a physician.   Please follow up as directed and return to the ER or see a physician for new or worsening symptoms (fevers, uncontrollable pain, other).  Thank you. Stay well hyrated. Take ibuprofen 600 mg every 6 hrs for pain, for severe pain take your stronger pain medicines.

## 2013-04-20 ENCOUNTER — Telehealth: Payer: Self-pay | Admitting: Nurse Practitioner

## 2013-04-20 MED ORDER — NORETHINDRONE 0.35 MG PO TABS: 1.0000 | ORAL_TABLET | Freq: Every day | ORAL | Status: DC

## 2013-04-20 NOTE — Telephone Encounter (Signed)
Pt checking on refill request for birth control to pharmacy. Pharmacy says they faxed over request. Pt would like to speak with nurse about how she should start taking her pills after missing 4 days.

## 2013-04-20 NOTE — Telephone Encounter (Signed)
Spoke with patient. Advised refill for one month with one refill sent. AEX already scheduled with Milford Cage, FNP for 05/14/13. Advised can start new pack today due to missed pills. Can expect break through bleeding. Patient aware. Advised of need for back up method of birth control. States husband has had vasectomy.  Advised would see her for AEX as scheduled.   Routing to provider for final review. Patient agreeable to disposition. Will close encounter

## 2013-04-22 ENCOUNTER — Ambulatory Visit: Payer: BC Managed Care – PPO | Admitting: Nurse Practitioner

## 2013-05-14 ENCOUNTER — Encounter: Payer: Self-pay | Admitting: Nurse Practitioner

## 2013-05-14 ENCOUNTER — Ambulatory Visit (INDEPENDENT_AMBULATORY_CARE_PROVIDER_SITE_OTHER): Payer: BC Managed Care – PPO | Admitting: Nurse Practitioner

## 2013-05-14 VITALS — BP 106/68 | HR 64 | Ht 59.5 in | Wt 109.0 lb

## 2013-05-14 DIAGNOSIS — Z01419 Encounter for gynecological examination (general) (routine) without abnormal findings: Secondary | ICD-10-CM

## 2013-05-14 DIAGNOSIS — Z0000 Encounter for general adult medical examination without abnormal findings: Secondary | ICD-10-CM

## 2013-05-14 LAB — POCT URINALYSIS DIPSTICK
Bilirubin, UA: NEGATIVE
Blood, UA: NEGATIVE
Glucose, UA: NEGATIVE
KETONES UA: NEGATIVE
Leukocytes, UA: NEGATIVE
Nitrite, UA: NEGATIVE
PROTEIN UA: NEGATIVE
Urobilinogen, UA: NEGATIVE
pH, UA: 6

## 2013-05-14 MED ORDER — NORETHINDRONE 0.35 MG PO TABS: 1.0000 | ORAL_TABLET | Freq: Every day | ORAL | Status: DC

## 2013-05-14 NOTE — Patient Instructions (Signed)

## 2013-05-14 NOTE — Progress Notes (Signed)
Patient ID: Isabel Thomas, female   DOB: 07-18-1970, 43 y.o.   MRN: [id number] 43 y.o. G0P0 Married Caucasian Fe here for annual exam.  She feels well.  Menses is regular on POP.  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  Patient's last menstrual period was 05/07/2013.          Sexually active: yes  The current method of family planning is vasectomy.    Exercising: yes  modified light strength training and walking Smoker:  yes  Health Maintenance: Pap:  04/18/11, WNL with HR HPV negative MMG:  03/17/12, Bi-Rads 1:  negative TDaP:  ? Labs: HB:  13.9 Urine:  Negative    reports that she has been smoking.  She has never used smokeless tobacco. She reports that she does not drink alcohol or use illicit drugs.  Past Medical History  Diagnosis Date  . Arnold-Chiari syndrome 2000    cause of Headaches  . Fibromyalgia syndrome 2008    Past Surgical History  Procedure Laterality Date  . Knee arthroscopy Right 11/11    with lateral release  . Cholecystectomy, laparoscopic  10/05  . Intrauterine device insertion  09/13/96, removed 4/09    Current Outpatient Prescriptions  Medication Sig Dispense Refill  . Biotin (BIOTIN 5000) 5 MG CAPS Take 5 mg by mouth daily.       . cholecalciferol (VITAMIN D) 1000 UNITS tablet Take 1,000 Units by mouth daily.      . clonazePAM (KLONOPIN) 0.5 MG tablet Take 1 tablet by mouth as needed.      . fluticasone (FLONASE) 50 MCG/ACT nasal spray Place 2 sprays into the nose daily as needed for allergies.       . hydrocortisone (ANUSOL-HC) 25 MG suppository Place 25 mg rectally 2 (two) times daily as needed for hemorrhoids or itching.       Marland Kitchen ibuprofen (ADVIL,MOTRIN) 200 MG tablet Take 200-400 mg by mouth every 6 (six) hours as needed for fever, headache or moderate pain.      Marland Kitchen levothyroxine (SYNTHROID, LEVOTHROID) 88 MCG tablet Take 88 mcg by mouth daily before breakfast.      . norethindrone (MICRONOR,CAMILA,ERRIN) 0.35 MG tablet  Take 1 tablet (0.35 mg total) by mouth daily.  1 Package  1  . oxycodone (OXY-IR) 5 MG capsule Take 10-15 mg by mouth every 8 (eight) hours as needed for pain.       . Tapentadol HCl (NUCYNTA) 100 MG TABS Take 100 mg by mouth 2 (two) times daily.      . temazepam (RESTORIL) 15 MG capsule Take 15-30 mg by mouth at bedtime as needed for sleep.      . Ascorbic Acid (VITAMIN C PO) Take 1 tablet by mouth daily.       . calcium carbonate (OS-CAL) 600 MG TABS Take 600 mg by mouth 2 (two) times daily with a meal.      . folic acid (FOLVITE) 564 MCG tablet Take 400 mcg by mouth daily.      . ondansetron (ZOFRAN ODT) 4 MG disintegrating tablet 4mg  ODT q4 hours prn nausea/vomit  10 tablet  0  . promethazine (PHENERGAN) 25 MG tablet Take 1 tablet by mouth as needed.      . vitamin E 400 UNIT capsule Take 400 Units by mouth daily.       No current facility-administered medications for this visit.    Family History  Problem Relation Age of Onset  . Cancer  Maternal Grandmother 81    colon  . Cancer Mother 15    thyroid  . Thyroid disease Mother   . Thyroid disease Father   . Colon cancer Maternal Uncle 31    ROS:  Pertinent items are noted in HPI.  Otherwise, a comprehensive ROS was negative.  Exam:   BP 106/68  Pulse 64  Ht 4' 11.5" (1.511 m)  Wt 109 lb (49.442 kg)  BMI 21.66 kg/m2  LMP 05/07/2013 Height: 4' 11.5" (151.1 cm)  Ht Readings from Last 3 Encounters:  05/14/13 4' 11.5" (1.511 m)  04/20/12 4' 11.5" (1.511 m)  09/05/08 4' 2.75" (1.289 m)    General appearance: alert, cooperative and appears stated age Head: Normocephalic, without obvious abnormality, atraumatic Neck: no adenopathy, supple, symmetrical, trachea midline and thyroid normal to inspection and palpation Lungs: clear to auscultation bilaterally Breasts: normal appearance, no masses or tenderness Heart: regular rate and rhythm Abdomen: soft, non-tender; no masses,  no organomegaly Extremities: extremities normal,  atraumatic, no cyanosis or edema Skin: Skin color, texture, turgor normal. No rashes or lesions Lymph nodes: Cervical, supraclavicular, and axillary nodes normal. No abnormal inguinal nodes palpated Neurologic: Grossly normal   Pelvic: External genitalia:  no lesions              Urethra:  normal appearing urethra with no masses, tenderness or lesions              Bartholin's and Skene's: normal                 Vagina: normal appearing vagina with normal color and discharge, no lesions              Cervix: anteverted              Pap taken: no Bimanual Exam:  Uterus:  normal size, contour, position, consistency, mobility, non-tender              Adnexa: no mass, fullness, tenderness               Rectovaginal: Confirms               Anus:  normal sphincter tone, no lesions  A:  Well Woman with normal exam  Contraception with POP  History of migraine headaches  History of Pineal cyst, fibromyalgia  P:   Reviewed health and wellness pertinent to exam  Pap smear not taken today  Mammogram due now and will schedule  Refill on POP for a year  Counseled on breast self exam, mammography screening, adequate intake of calcium and vitamin D, diet and exercise return annually or prn  An After Visit Summary was printed and given to the patient.

## 2013-05-17 LAB — HEMOGLOBIN, FINGERSTICK: Hemoglobin, fingerstick: 13.9 g/dL (ref 12.0–16.0)

## 2013-05-26 NOTE — Progress Notes (Signed)
Encounter reviewed by Dr. Brook Silva.  

## 2013-07-21 ENCOUNTER — Encounter: Payer: Self-pay | Admitting: Internal Medicine

## 2013-07-28 ENCOUNTER — Ambulatory Visit: Payer: Medicare Other | Admitting: Physician Assistant

## 2013-08-19 ENCOUNTER — Telehealth: Payer: Self-pay | Admitting: *Deleted

## 2013-08-19 NOTE — Telephone Encounter (Signed)
I called [facility] to advise this pateint had to cancel her appointment scheduled with Nicoletta Ba PA for 07-28-2013 . She was having some neurological problems. She has not rescheduled yet.

## 2014-02-08 ENCOUNTER — Telehealth: Payer: Self-pay | Admitting: Nurse Practitioner

## 2014-02-08 NOTE — Telephone Encounter (Signed)
Spoke with patient. Patient states that for a couple of weeks she has noticed a itchy, irritated area in her panty line. Patient states that it has now turned into a bump that is increasing in size and it tender all the time. States that she has had cysts before in the same area and was told to come as soon as possible if she feels one again. Patient requesting appointment tomorrow with Milford Cage, FNP. Advised she will be out of the office tomorrow. Offered appointment with another provider but patient declines. Appointment scheduled for Thursday at 2:15pm with Milford Cage, [place]. Agreeable to date and time. Advised if area worsens over night will need to be seen earlier for evaluation. Patient is agreeable.  Routing to provider for final review. Patient agreeable to disposition. Will close encounter

## 2014-02-08 NOTE — Telephone Encounter (Signed)
Patient called and left a message on the answering machine at lunch. She is requesting an appointment with Waldemar Dickens, NP for a "cyst" on her "upper leg near the underwear line."

## 2014-02-10 ENCOUNTER — Ambulatory Visit (INDEPENDENT_AMBULATORY_CARE_PROVIDER_SITE_OTHER): Payer: BLUE CROSS/BLUE SHIELD | Admitting: Nurse Practitioner

## 2014-02-10 ENCOUNTER — Encounter: Payer: Self-pay | Admitting: Nurse Practitioner

## 2014-02-10 VITALS — BP 100/72 | HR 76 | Ht 59.5 in | Wt 114.2 lb

## 2014-02-10 DIAGNOSIS — L723 Sebaceous cyst: Secondary | ICD-10-CM

## 2014-02-10 NOTE — Progress Notes (Signed)
Subjective:     Patient ID: Isabel Thomas, female   DOB: [birth year], 44 y.o.   MRN: [id number]  HPI  This 44 yo MW Fe G0 presents with an itchy area at the panty line.  It then became more of a 'bump'.  No exudate, nothing came out. Some sensitivity.   No fever or chills.   Currently having migraine HA's but no other constitutional symptoms.  LMP 3-4 months ago on current OCP.  She has history of cyst in the inguinal area that flare from time to time.   Review of Systems  Constitutional: Negative for fever, chills, appetite change and fatigue.  Respiratory: Negative.   Cardiovascular: Negative.   Gastrointestinal: Negative.   Genitourinary: Negative.   Skin: Positive for wound.  Neurological: Positive for headaches.       Objective:   Physical Exam  Constitutional: She is oriented to person, place, and time. She appears well-developed and well-nourished. She appears distressed.  Secondary to migraine headache  Abdominal: Soft. There is no tenderness. There is no rebound and no guarding.  Genitourinary:  Left groin area with a 3 mm small cyst.  No exudate and is already healing.  No redness or warmth.  No other vulvar or vaginal lesions are noted.  Neurological: She is alert and oriented to person, place, and time.  Psychiatric: She has a normal mood and affect. Her behavior is normal. Judgment and thought content normal.       Assessment:     Sebaceous  Cyst Current migraine HA with photosensitivity - has med's    Plan:    No intervention is needed at this time Watchful waiting and Neosporin prn Warm compresses prn

## 2014-02-11 NOTE — Progress Notes (Signed)
Reviewed personally.  M. Suzanne Dianca Owensby, MD.  

## 2014-04-07 ENCOUNTER — Other Ambulatory Visit: Payer: Self-pay | Admitting: Nurse Practitioner

## 2014-04-07 ENCOUNTER — Other Ambulatory Visit: Payer: Self-pay | Admitting: *Deleted

## 2014-04-07 MED ORDER — NORETHINDRONE 0.35 MG PO TABS: 1.0000 | ORAL_TABLET | Freq: Every day | ORAL | Status: DC

## 2014-04-07 NOTE — Telephone Encounter (Signed)
Faxed Medication refill request for Isabel Thomas from [facility] Last AEX:  05/14/13 with PG  Next AEX: 05/16/14 with PG  Last MMG (if hormonal medication request): 03/17/12 Bi-Rads 1: Negative  Refill authorized: Please advise.  Fax states: Patient received 3 pks on 05/28/13 for May, June, July ; 08/05/13 for August, September, October; 10/27/13 for the remainder of October, November and December.  Last refill picked up on 01/15/14 for Jan, Feb, March needs refill through appointment in May.  Okay to send in 3 more packs to last patient until AEX in May?

## 2014-04-07 NOTE — Telephone Encounter (Signed)
yes

## 2014-05-16 ENCOUNTER — Encounter: Payer: Self-pay | Admitting: Nurse Practitioner

## 2014-05-16 ENCOUNTER — Ambulatory Visit (INDEPENDENT_AMBULATORY_CARE_PROVIDER_SITE_OTHER): Payer: Medicare Other | Admitting: Nurse Practitioner

## 2014-05-16 VITALS — BP 104/64 | HR 68 | Resp 16 | Ht 59.5 in | Wt 117.0 lb

## 2014-05-16 DIAGNOSIS — Z1239 Encounter for other screening for malignant neoplasm of breast: Secondary | ICD-10-CM

## 2014-05-16 DIAGNOSIS — Z0000 Encounter for general adult medical examination without abnormal findings: Secondary | ICD-10-CM

## 2014-05-16 DIAGNOSIS — Z01419 Encounter for gynecological examination (general) (routine) without abnormal findings: Secondary | ICD-10-CM | POA: Diagnosis not present

## 2014-05-16 DIAGNOSIS — Z124 Encounter for screening for malignant neoplasm of cervix: Secondary | ICD-10-CM | POA: Diagnosis not present

## 2014-05-16 LAB — POCT URINALYSIS DIPSTICK
Bilirubin, UA: NEGATIVE
Blood, UA: NEGATIVE
Glucose, UA: NEGATIVE
KETONES UA: NEGATIVE
Leukocytes, UA: NEGATIVE
Nitrite, UA: NEGATIVE
Protein, UA: NEGATIVE
UROBILINOGEN UA: NEGATIVE
pH, UA: 5

## 2014-05-16 MED ORDER — NORETHINDRONE 0.35 MG PO TABS: 1.0000 | ORAL_TABLET | Freq: Every day | ORAL | Status: DC

## 2014-05-16 NOTE — Progress Notes (Signed)
44 y.o. G0P0 Married  Caucasian Fe here for annual exam.  Menses is usually absent on POP.  Recently with taking antibiotics for sinusitis, she had a cycle that was 3 day and light.  Some cramps.  No other problems with POP.  Patient's last menstrual period was 05/09/2014.          Sexually active: Yes.    The current method of family planning is vasectomy/pills    Exercising: Yes.    strength training, cardio,walking Smoker:  yes  Health Maintenance: Pap: 04-18-11 neg HPV HR neg MMG: 03-17-12 category 4 density,bi rads 1:neg will schedule for pt. Colonoscopy:  2014? TDaP:  Unsure, pt declines today Labs: Poct urine-neg Self breast exam: done occ   reports that she has been smoking.  She has never used smokeless tobacco. She reports that she does not drink alcohol or use illicit drugs.  Past Medical History  Diagnosis Date  . Arnold-Chiari syndrome 2000    cause of Headaches  . Fibromyalgia syndrome 2008  . Pineal gland cyst 2001    will be having surgery in 2015?    Past Surgical History  Procedure Laterality Date  . Knee arthroscopy Right 11/11    with lateral release  . Cholecystectomy, laparoscopic  10/05  . Intrauterine device insertion  09/13/96, removed 4/09    paraguard    Current Outpatient Prescriptions  Medication Sig Dispense Refill  . amitriptyline (ELAVIL) 10 MG tablet     . clonazePAM (KLONOPIN) 1 MG tablet     . fluticasone (FLONASE) 50 MCG/ACT nasal spray Place 2 sprays into the nose daily as needed for allergies.     . hydrocortisone (ANUSOL-HC) 25 MG suppository Place 25 mg rectally 2 (two) times daily as needed for hemorrhoids or itching.     . levothyroxine (SYNTHROID, LEVOTHROID) 88 MCG tablet Take 88 mcg by mouth daily before breakfast.    . norethindrone (MICRONOR,CAMILA,ERRIN) 0.35 MG tablet Take 1 tablet (0.35 mg total) by mouth daily. 3 Package 4  . NUCYNTA ER 100 MG TB12 Take 1 tablet by mouth 2 (two) times daily.  0  . oxyCODONE (OXY IR/ROXICODONE)  5 MG immediate release tablet 5 mg as needed.    . promethazine (PHENERGAN) 25 MG tablet Take 1 tablet by mouth as needed.    . temazepam (RESTORIL) 15 MG capsule Take 15-30 mg by mouth at bedtime as needed for sleep.     No current facility-administered medications for this visit.    Family History  Problem Relation Age of Onset  . Cancer Maternal Grandmother 70    colon  . Cancer Mother 83    thyroid  . Thyroid disease Mother   . Thyroid disease Father   . Colon cancer Maternal Uncle 39    ROS:  Pertinent items are noted in HPI.  Otherwise, a comprehensive ROS was negative.  Exam:   BP 104/64 mmHg  Pulse 68  Resp 16  Ht 4' 11.5" (1.511 m)  Wt 117 lb (53.071 kg)  BMI 23.24 kg/m2  LMP 05/09/2014 Height: 4' 11.5" (151.1 cm) Ht Readings from Last 3 Encounters:  05/16/14 4' 11.5" (1.511 m)  02/10/14 4' 11.5" (1.511 m)  05/14/13 4' 11.5" (1.511 m)    General appearance: alert, cooperative and appears stated age Head: Normocephalic, without obvious abnormality, atraumatic Neck: no adenopathy, supple, symmetrical, trachea midline and thyroid normal to inspection and palpation Lungs: clear to auscultation bilaterally Breasts: normal appearance, no masses or tenderness Heart: regular rate and  rhythm Abdomen: soft, non-tender; no masses,  no organomegaly Extremities: extremities normal, atraumatic, no cyanosis or edema Skin: Skin color, texture, turgor normal. No rashes or lesions Lymph nodes: Cervical, supraclavicular, and axillary nodes normal. No abnormal inguinal nodes palpated Neurologic: Grossly normal   Pelvic: External genitalia:  no lesions              Urethra:  normal appearing urethra with no masses, tenderness or lesions              Bartholin's and Skene's: normal                 Vagina: normal appearing vagina with normal color and discharge, no lesions              Cervix: anteverted              Pap taken: Yes.   Bimanual Exam:  Uterus:  normal size,  contour, position, consistency, mobility, non-tender              Adnexa: no mass, fullness, tenderness               Rectovaginal: Confirms               Anus:  normal sphincter tone, no lesions  Chaperone present: Yes  A:  Well Woman with normal exam  Contraception with POP- usually amenorrhea on POP History of migraine headaches History of Pineal cyst, fibromyalgia   P:   Reviewed health and wellness pertinent to exam  Pap smear taken today  Mammogram is past due and we will schedule for her  Refill on [employer] for a year  Counseled on breast self exam, mammography screening, adequate intake of calcium and vitamin D, diet and exercise return annually or prn  An After Visit Summary was printed and given to the patient.

## 2014-05-16 NOTE — Patient Instructions (Signed)

## 2014-05-19 LAB — IPS PAP TEST WITH HPV

## 2014-05-22 NOTE — Progress Notes (Signed)
Encounter reviewed by Dr. Brook Silva.  

## 2014-06-17 ENCOUNTER — Encounter: Payer: Self-pay | Admitting: Internal Medicine

## 2014-07-01 ENCOUNTER — Telehealth: Payer: Self-pay | Admitting: Nurse Practitioner

## 2014-07-01 NOTE — Telephone Encounter (Signed)
Patient says she is having an issue that is painful.

## 2014-07-01 NOTE — Telephone Encounter (Signed)
Spoke with patient. Patient states that she had intercourse 6-7 days ago. 4-5 days ago she began to experience vaginal irritation. Noticed a "small hard bump" in vaginal area this morning that is tender to the touch. Denies fever, chills, or warmth to area. "I am not able to see it very well. I do not know if it is from rough intercourse or what it is." Advised patient will need to be seen in office for further evaluation of area. Patient is agreeable. Appointment scheduled for 07/04/2014 at 2:15pm. Patient is agreeable to date and time. If symptoms worsen or develops new symptoms aware will need to be seen at local urgent care over the weekend. Patient is agreeable.  Routing to provider for final review. Patient agreeable to disposition. Will close encounter.

## 2014-07-04 ENCOUNTER — Ambulatory Visit (INDEPENDENT_AMBULATORY_CARE_PROVIDER_SITE_OTHER): Payer: Medicare Other | Admitting: Nurse Practitioner

## 2014-07-04 ENCOUNTER — Encounter: Payer: Self-pay | Admitting: Nurse Practitioner

## 2014-07-04 VITALS — BP 100/70 | HR 80 | Ht 59.5 in | Wt 121.0 lb

## 2014-07-04 DIAGNOSIS — N898 Other specified noninflammatory disorders of vagina: Secondary | ICD-10-CM

## 2014-07-04 NOTE — Progress Notes (Signed)
44 y.o.Married white female G0P0  here with complaint of vaginal symptoms of irritation following SA over a week ago.  Symptoms started the next day with being sore and slightly swollen on the left labia.  She still has a discomfort especially with bike riding or sitting for long periods of time.  Describes no associated vaginal discharge or bladder symptoms.   Denies new personal products or vaginal dryness. No STD concerns. POP for contraceptions.  LMP: 05/09/14- usually absent on POP.  However, the Patient's husband was seen by opthalmalogist with a suspected herpetic lesion on the right eye  O:Healthy female WDWN Affect: normal, orientation x 3  Exam: no distress Abdomen: soft and non tender Lymph node: no enlargement or tenderness Pelvic exam: External genital: normal female.  She has a tender area along the left labia to touch but no lesion,  There is no discharge or signs of infection. BUS: negative Vagina: no discharge noted.    A: Vaginal irritation ? etiology   CP: Discussed findings of vulva irritaion and etiology. Discussed Aveeno or baking soda sitz bath for comfort. Avoid moist clothes or pads for extended period of time. If working out in gym clothes or swim suits for long periods of time change underwear or bottoms of swimsuit if possible. Olive Oil/Coconut Oil use for skin protection prior to activity can be used to external skin. Rx:  No treatment at this time other than OTC Cortisone prn comfort.  If symptoms persist or other lesions to call back.  Rv prn

## 2014-07-04 NOTE — Patient Instructions (Signed)
Continue to monitor and if worsens to call back.  OTC Cortisone prn.

## 2014-07-06 ENCOUNTER — Other Ambulatory Visit: Payer: Self-pay | Admitting: Certified Nurse Midwife

## 2014-07-06 NOTE — Progress Notes (Signed)
Encounter reviewed by Dr. Brook Silva.  

## 2014-07-06 NOTE — Telephone Encounter (Signed)
05/16/14 #3 packs 4 rfs [facility] [address]-Denied.

## 2014-12-27 ENCOUNTER — Other Ambulatory Visit: Payer: Self-pay | Admitting: Psychiatry

## 2014-12-27 DIAGNOSIS — E348 Other specified endocrine disorders: Secondary | ICD-10-CM

## 2014-12-30 IMAGING — MG MM DIGITAL SCREENING BILAT
4 series · 4 of 4 positions shown · non-contrast
Comparison: None.

CLINICAL DATA: Screening.

DIGITAL BILATERAL SCREENING MAMMOGRAM WITH CAD

[R CC]
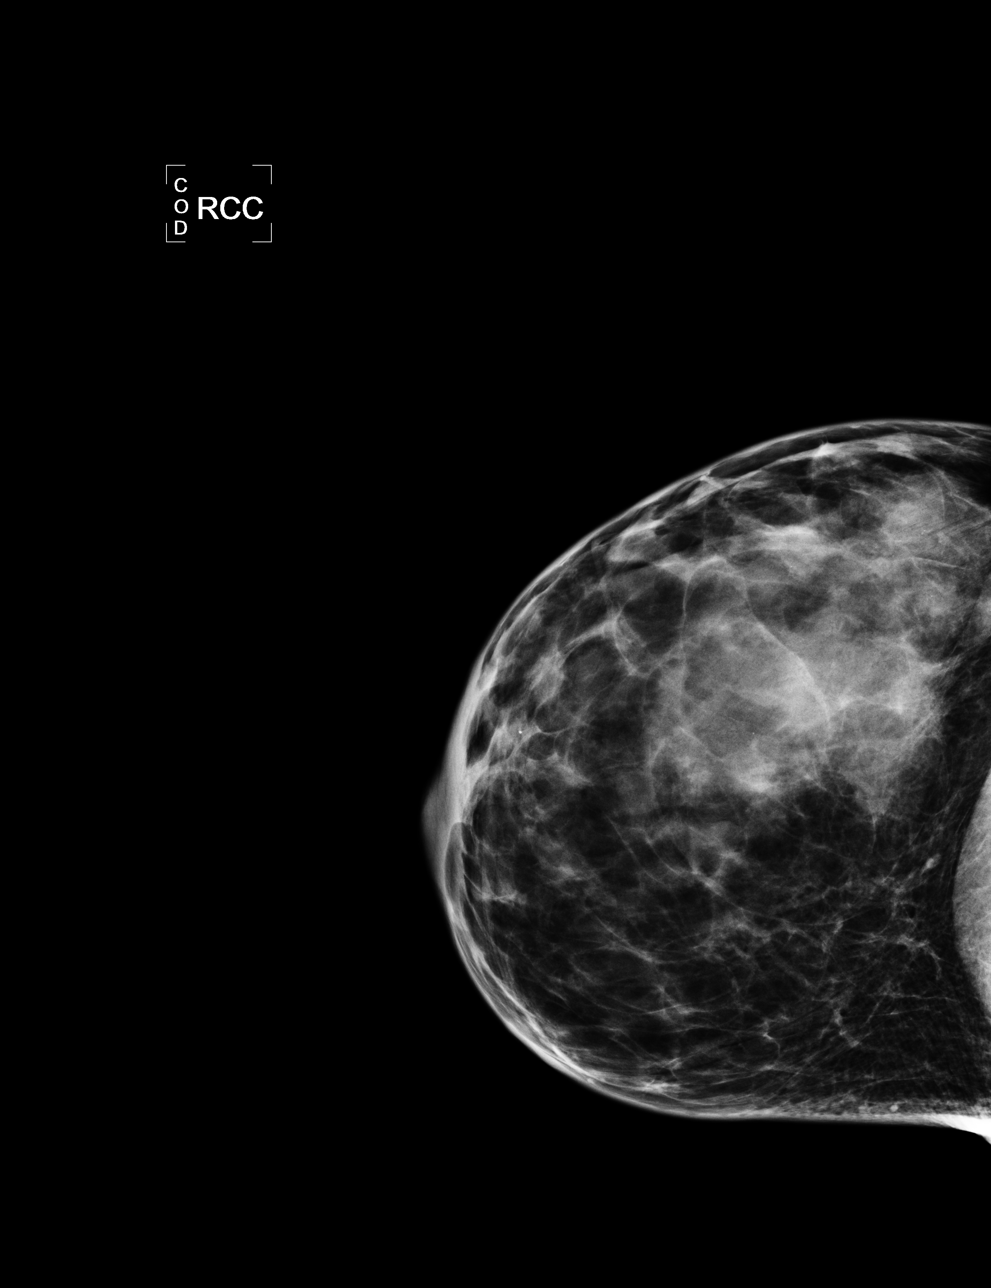

[L CC]
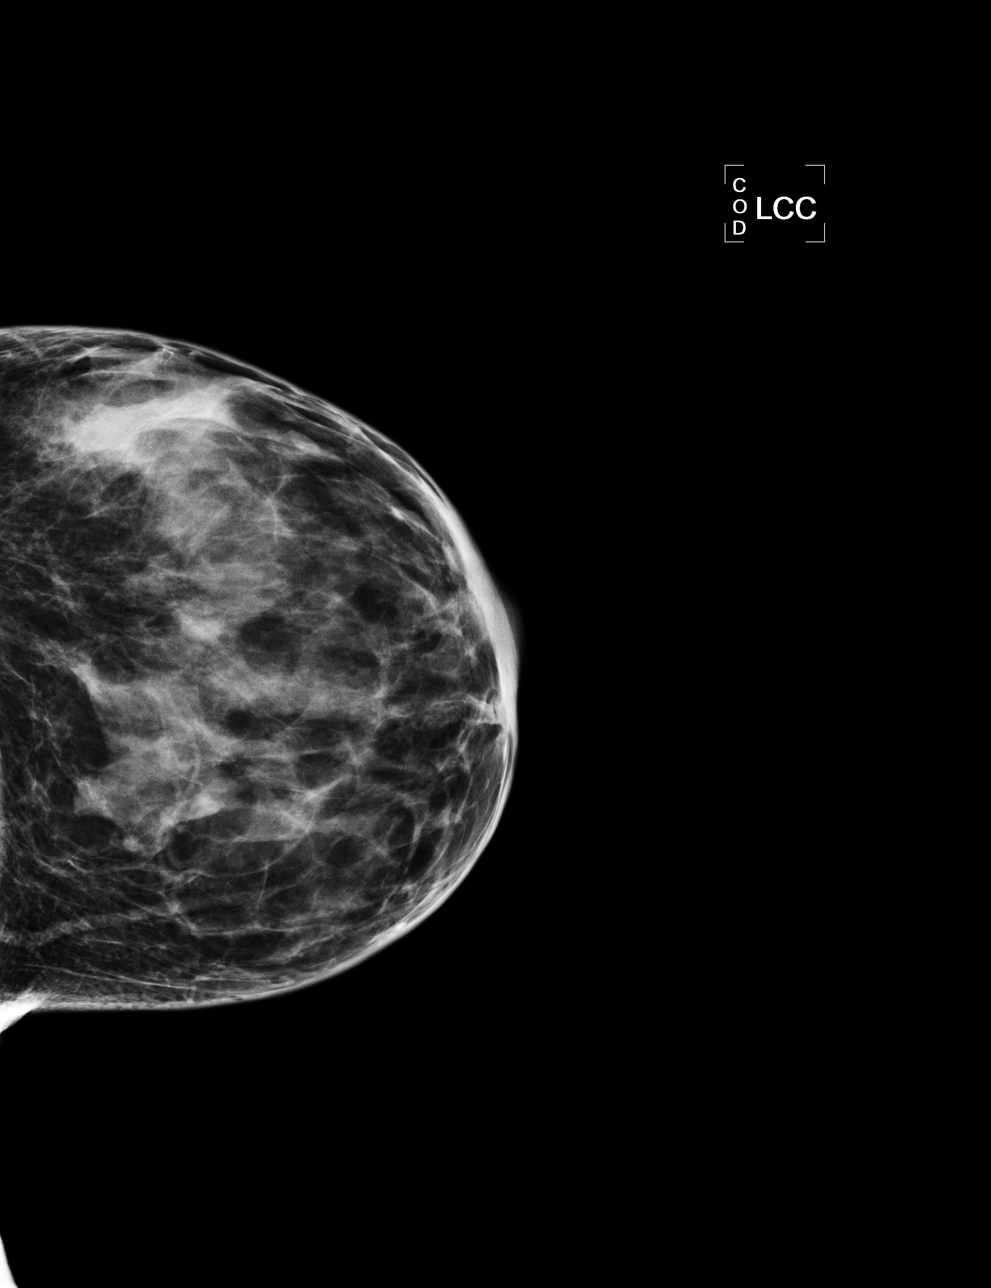

[L MLO]
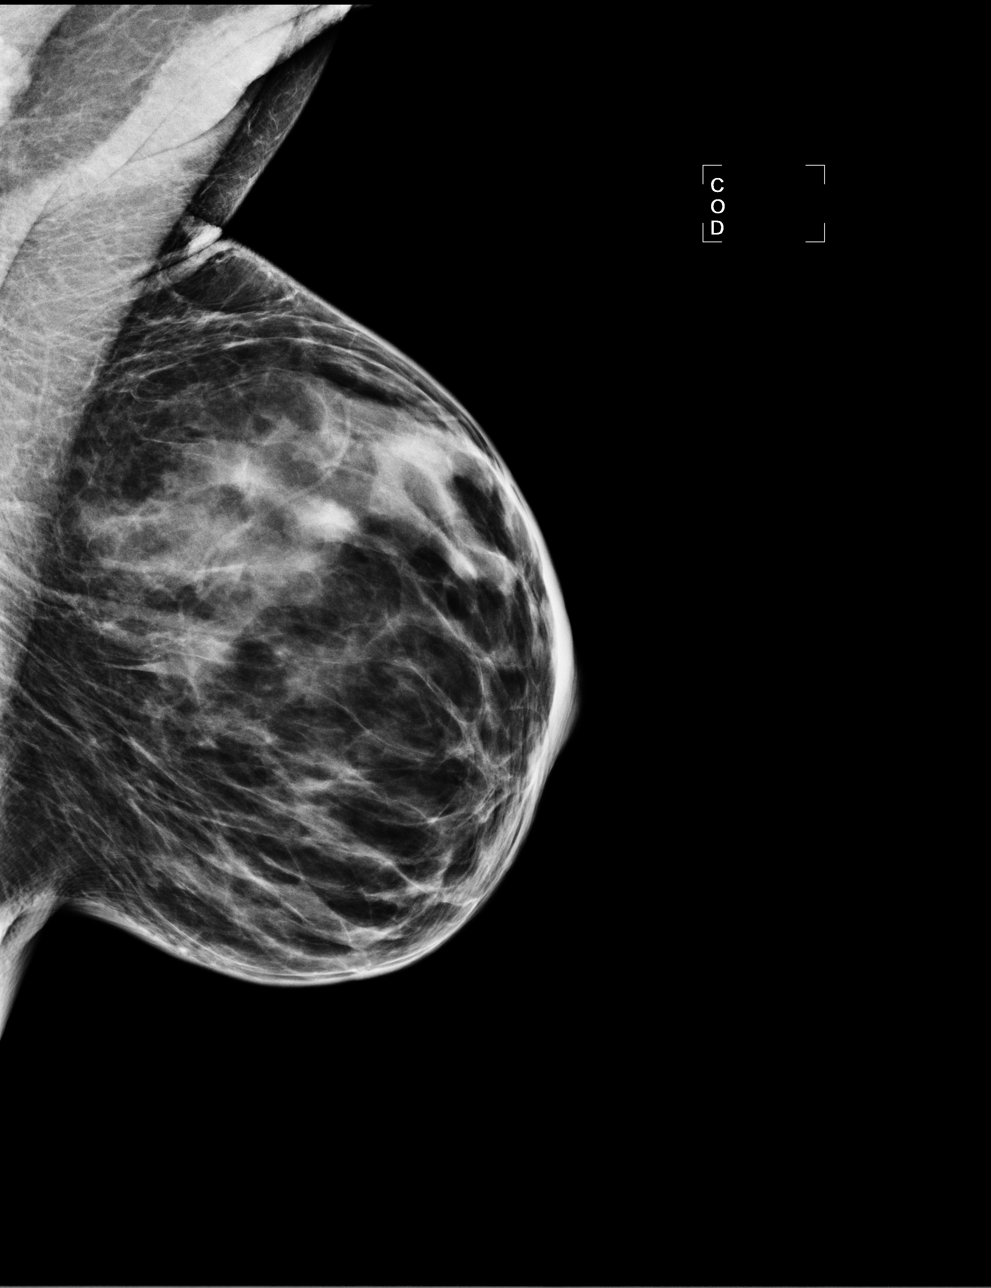

[R MLO]
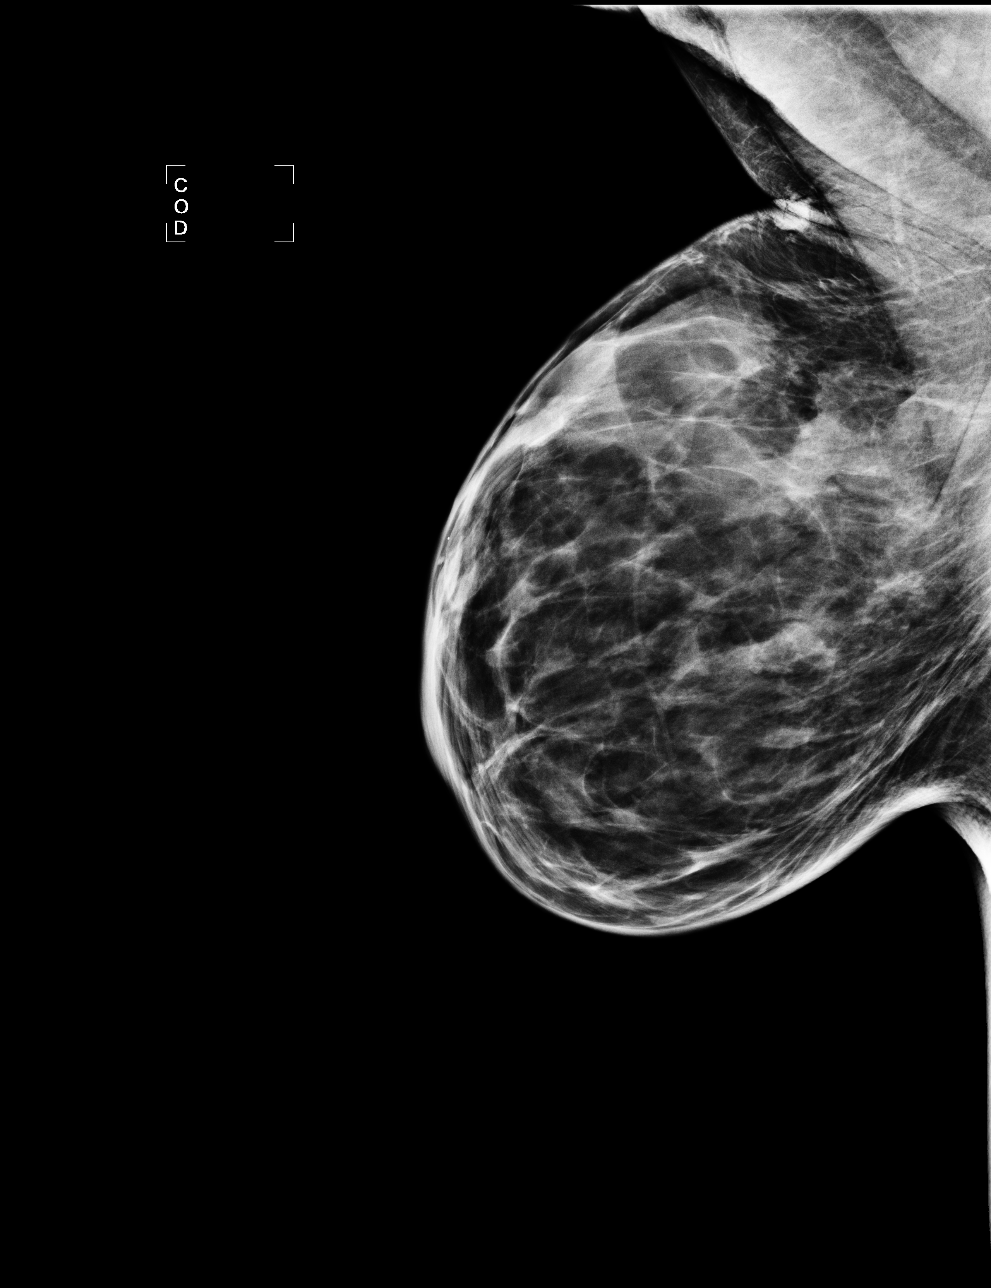

[4 of 4 positions shown; findings below may reference images not displayed]

FINDINGS: ACR Breast Density Category 4: The breast tissue is extremely
dense.

No suspicious masses, architectural distortion, or calcifications
are present.

Images were processed with CAD.
IMPRESSION: No mammographic evidence of malignancy.
A result letter of this screening mammogram will be mailed directly
to the patient.

RECOMMENDATION:
Screening mammogram in one year. (Code:[id number]-Z-2X1)

BI-RADS CATEGORY 1:  Negative.

## 2015-01-05 ENCOUNTER — Ambulatory Visit
Admission: RE | Admit: 2015-01-05 | Discharge: 2015-01-05 | Disposition: A | Discharge status: Home / Self Care | Payer: BLUE CROSS/BLUE SHIELD | Source: Ambulatory Visit | Attending: Psychiatry | Admitting: Psychiatry

## 2015-01-05 DIAGNOSIS — E348 Other specified endocrine disorders: Secondary | ICD-10-CM

## 2015-01-11 ENCOUNTER — Ambulatory Visit
Admission: RE | Admit: 2015-01-11 | Discharge: 2015-01-11 | Disposition: A | Discharge status: Home / Self Care | Payer: BLUE CROSS/BLUE SHIELD | Source: Ambulatory Visit | Attending: Psychiatry | Admitting: Psychiatry

## 2015-01-11 MED ORDER — GADOBENATE DIMEGLUMINE 529 MG/ML IV SOLN
9.0000 mL | Freq: Once | INTRAVENOUS | Status: AC | PRN
  Administered 2015-01-11: 9 mL via INTRAVENOUS

## 2015-01-23 ENCOUNTER — Telehealth: Payer: Self-pay | Admitting: Nurse Practitioner

## 2015-01-23 NOTE — Telephone Encounter (Signed)
Patient called she is asking if going forward we will not send any labs/paps to [facility] it is out of network with her insurance. She will be receiving an email with labs that she can use with her insurance and will give [place] a call with that info. Routed to Ms. Patty FYI

## 2015-01-23 NOTE — Telephone Encounter (Signed)
She will need to inform us or any other health care provider at time of visit that labs must be sent out.  We have no way of noting this, so she has to say each and every time of apt if any labs are done.

## 2015-01-31 ENCOUNTER — Ambulatory Visit
Admission: RE | Admit: 2015-01-31 | Discharge: 2015-01-31 | Disposition: A | Discharge status: Home / Self Care | Payer: BLUE CROSS/BLUE SHIELD | Source: Ambulatory Visit | Attending: Nurse Practitioner | Admitting: Nurse Practitioner

## 2015-01-31 DIAGNOSIS — Z1239 Encounter for other screening for malignant neoplasm of breast: Secondary | ICD-10-CM

## 2015-02-14 ENCOUNTER — Encounter: Payer: Self-pay | Admitting: Nurse Practitioner

## 2015-02-14 ENCOUNTER — Ambulatory Visit (INDEPENDENT_AMBULATORY_CARE_PROVIDER_SITE_OTHER): Payer: BLUE CROSS/BLUE SHIELD | Admitting: Nurse Practitioner

## 2015-02-14 VITALS — BP 120/74 | HR 64 | Ht 59.5 in | Wt 113.0 lb

## 2015-02-14 DIAGNOSIS — N912 Amenorrhea, unspecified: Secondary | ICD-10-CM | POA: Diagnosis not present

## 2015-02-14 NOTE — Progress Notes (Signed)
Patient ID: Isabel Thomas, female   DOB: 1970/11/25, 45 y.o.   MRN: [id number] S: This 45 yo WM 45 y.o. female here for a consult visit to discuss menopausal changes.  She is on POP due to smoking history with amenorrhea since April of 2016.   From time to time initially she would get PMS symptoms and bloating but none of this since late summer.  Most recently gets "hot spells" and feels warm in general.  She is bothered most by mood changes.  She and her husband have recently separated because she is unable to cope with her feelings of anxiety and behavior.  She regrets this and hopes for them to get back together.  She always has insomnia and now that is worse.  While she was having some "hormonal issues" yrs ago that seemed to get better with taking OCP.  She has now stopped smoking in December and those withdrawal symptoms have passed.  She feels that she needs hormonal treatment for these symptoms.  O: Mood and affect is normal and able to relate her concerns  FSH is drawn  A: Perimenopausal changes  Plan: Will follow with labs and plan is to start on OTC Estroven for now.  If she needs low dose OCP - would like to wait several months out to start back on OCP or HRT. Information is given about Menopause  Consult time: 15 minutes

## 2015-02-14 NOTE — Patient Instructions (Addendum)
Estroven OTC - mood, night sweats

## 2015-02-14 NOTE — Progress Notes (Signed)
Encounter reviewed by Dr. Aundria Rud. I do not recommend combined OCPs as patient has just ceased smoking.

## 2015-02-15 LAB — FOLLICLE STIMULATING HORMONE: FSH: 9.7 m[IU]/mL

## 2015-03-07 ENCOUNTER — Other Ambulatory Visit: Payer: Self-pay | Admitting: Nurse Practitioner

## 2015-03-07 DIAGNOSIS — F430 Acute stress reaction: Secondary | ICD-10-CM

## 2015-05-16 DIAGNOSIS — K838 Other specified diseases of biliary tract: Secondary | ICD-10-CM | POA: Insufficient documentation

## 2015-05-16 DIAGNOSIS — L659 Nonscarring hair loss, unspecified: Secondary | ICD-10-CM | POA: Insufficient documentation

## 2015-05-16 DIAGNOSIS — G5700 Lesion of sciatic nerve, unspecified lower limb: Secondary | ICD-10-CM | POA: Insufficient documentation

## 2015-05-16 DIAGNOSIS — L650 Telogen effluvium: Secondary | ICD-10-CM | POA: Insufficient documentation

## 2015-05-16 DIAGNOSIS — J309 Allergic rhinitis, unspecified: Secondary | ICD-10-CM | POA: Insufficient documentation

## 2015-05-17 ENCOUNTER — Telehealth: Payer: Self-pay | Admitting: Nurse Practitioner

## 2015-05-17 ENCOUNTER — Ambulatory Visit: Payer: Medicare Other | Admitting: Nurse Practitioner

## 2015-05-17 NOTE — Telephone Encounter (Signed)
Patient canceled her aex appointment today due to severe sunburn on her back. Patient rescheduled to 05/24/15.

## 2015-05-24 ENCOUNTER — Ambulatory Visit (INDEPENDENT_AMBULATORY_CARE_PROVIDER_SITE_OTHER): Payer: BLUE CROSS/BLUE SHIELD | Admitting: Nurse Practitioner

## 2015-05-24 ENCOUNTER — Encounter: Payer: Self-pay | Admitting: Nurse Practitioner

## 2015-05-24 VITALS — BP 100/60 | HR 60 | Ht 59.25 in | Wt 115.0 lb

## 2015-05-24 DIAGNOSIS — Z0000 Encounter for general adult medical examination without abnormal findings: Secondary | ICD-10-CM

## 2015-05-24 DIAGNOSIS — Z01419 Encounter for gynecological examination (general) (routine) without abnormal findings: Secondary | ICD-10-CM | POA: Diagnosis not present

## 2015-05-24 LAB — POCT URINALYSIS DIPSTICK
BILIRUBIN UA: NEGATIVE
Blood, UA: NEGATIVE
Glucose, UA: NEGATIVE
KETONES UA: NEGATIVE
LEUKOCYTES UA: NEGATIVE
NITRITE UA: NEGATIVE
Protein, UA: NEGATIVE
Urobilinogen, UA: NEGATIVE
pH, UA: 7

## 2015-05-24 NOTE — Patient Instructions (Signed)

## 2015-05-24 NOTE — Progress Notes (Signed)
Patient ID: Isabel Thomas, female   DOB: 05-23-1970, 45 y.o.   MRN: [id number]  45 y.o. G0P0 Married  Caucasian Fe here for annual exam.  Still irregular menses on POP.  She will have amenorrhea for several months then have light spotting for a few days.  This last menses in May had a lot more cramps but still only spotting.   Some vaginal dryness but not SA.  Continues to smoke social or when stressed.  Still has some marital issues and wishes to be seen by Marya Thomas.   Patient's last menstrual period was 05/15/2015 (exact date).          Sexually active: Yes.    The current method of family planning is vasectomy, oral progesterone-only contraceptive and abstinence.    Exercising: Yes.    Home exercise routine includes yoga and walking. Smoker:  Quit in January, occasional cigarette  Health Maintenance: Pap: 05/16/14, Negative with neg HR HPV MMG: 01/31/15, Bi-Rads 1: Negative Colonoscopy: 07/29/08, Normal, repeat at age 77 (2022) TDaP: unsure, declined HIV: 1990's Labs: discuss today   Urine: negative   reports that she quit smoking about 5 months ago. She has never used smokeless tobacco. She reports that she does not drink alcohol or use illicit drugs.  Past Medical History  Diagnosis Date  . Arnold-Chiari syndrome ([place]) 2000    cause of Headaches  . Fibromyalgia syndrome 2008  . Pineal gland cyst 2001    will be having surgery in 2015?    Past Surgical History  Procedure Laterality Date  . Knee arthroscopy Right 11/11    with lateral release  . Cholecystectomy, laparoscopic  10/05  . Intrauterine device insertion  09/13/96, removed 4/09    paraguard    Current Outpatient Prescriptions  Medication Sig Dispense Refill  . clonazePAM (KLONOPIN) 1 MG tablet Take 1 mg by mouth at bedtime as needed.  5  . fluticasone (FLONASE) 50 MCG/ACT nasal spray Place 2 sprays into the nose daily as needed for allergies.     . hydrocortisone (ANUSOL-HC) 25 MG suppository Place 25 mg  rectally 2 (two) times daily as needed for hemorrhoids or itching.     . levothyroxine (SYNTHROID, LEVOTHROID) 88 MCG tablet Take 88 mcg by mouth daily before breakfast.    . norethindrone (MICRONOR,CAMILA,ERRIN) 0.35 MG tablet Take 1 tablet (0.35 mg total) by mouth daily. 3 Package 4  . NUCYNTA ER 100 MG TB12 Take 1 tablet by mouth 2 (two) times daily.  0  . oxyCODONE (OXY IR/ROXICODONE) 5 MG immediate release tablet 5 mg as needed.    . promethazine (PHENERGAN) 25 MG tablet Take 1 tablet by mouth as needed.    . temazepam (RESTORIL) 15 MG capsule Take 15-30 mg by mouth at bedtime as needed for sleep.    Marland Kitchen triamterene-hydrochlorothiazide (MAXZIDE) 75-50 MG tablet Take 1 tablet by mouth daily.  3   No current facility-administered medications for this visit.    Family History  Problem Relation Age of Onset  . Cancer Maternal Grandmother 43    colon  . Cancer Mother 25    thyroid  . Thyroid disease Mother   . Thyroid disease Father   . Colon cancer Maternal Uncle 59    ROS:  Pertinent items are noted in HPI.  Otherwise, a comprehensive ROS was negative.  Exam:   BP 100/60 mmHg  Pulse 60  Ht 4' 11.25" (1.505 m)  Wt 115 lb (52.164 kg)  BMI 23.03 kg/m2  LMP 05/15/2015 (Exact Date) Height: 4' 11.25" (150.5 cm) Ht Readings from Last 3 Encounters:  05/24/15 4' 11.25" (1.505 m)  02/14/15 4' 11.5" (1.511 m)  07/04/14 4' 11.5" (1.511 m)    General appearance: alert, cooperative and appears stated age.   Always wears sunglasses. Head: Normocephalic, without obvious abnormality, atraumatic Neck: no adenopathy, supple, symmetrical, trachea midline and thyroid normal to inspection and palpation Lungs: clear to auscultation bilaterally Breasts: normal appearance, no masses or tenderness Heart: regular rate and rhythm Abdomen: soft, non-tender; no masses,  no organomegaly Extremities: extremities normal, atraumatic, no cyanosis or edema Skin: Skin color, texture, turgor normal. No  rashes or lesions Lymph nodes: Cervical, supraclavicular, and axillary nodes normal. No abnormal inguinal nodes palpated Neurologic: Grossly normal   Pelvic: External genitalia:  no lesions              Urethra:  normal appearing urethra with no masses, tenderness or lesions              Bartholin's and Skene's: normal                 Vagina: normal appearing vagina with normal color and discharge, no lesions              Cervix: anteverted              Pap taken: No. Bimanual Exam:  Uterus:  normal size, contour, position, consistency, mobility, non-tender              Adnexa: no mass, fullness, tenderness               Rectovaginal: Confirms               Anus:  normal sphincter tone, no lesions  Chaperone present: no  A:  Well Woman with normal exam  Contraception with POP- usually amenorrhea on POP History of migraine headaches History of Pineal cyst, fibromyalgia  Marital discord    P:   Reviewed health and wellness pertinent to exam  Pap smear as above  Mammogram is due 01/2016  Refill on POP for a year  Phone call is made to [employer] and they will schedule her an apt. - referral was previously put in and she could not talk and make the apt at that time.  Counseled on breast self exam, mammography screening, use and side effects of OCP's, adequate intake of calcium and vitamin D, diet and exercise return annually or prn  An After Visit Summary was printed and given to the patient.

## 2015-05-28 NOTE — Progress Notes (Signed)
Encounter reviewed by Dr. Roxene Alviar Amundson C. Silva.  

## 2015-06-06 ENCOUNTER — Other Ambulatory Visit: Payer: Self-pay | Admitting: Nurse Practitioner

## 2015-06-06 NOTE — Telephone Encounter (Signed)
Medication refill request: Isabel Thomas Last AEX:  05-24-15 Next AEX: 05-24-16  Last MMG (if hormonal medication request): 02-01-15 WNL   Refill authorized: please advise

## 2015-12-26 ENCOUNTER — Ambulatory Visit (INDEPENDENT_AMBULATORY_CARE_PROVIDER_SITE_OTHER): Payer: BLUE CROSS/BLUE SHIELD | Admitting: Nurse Practitioner

## 2015-12-26 ENCOUNTER — Encounter: Payer: Self-pay | Admitting: Nurse Practitioner

## 2015-12-26 VITALS — BP 110/76 | HR 64 | Ht 59.25 in | Wt 112.0 lb

## 2015-12-26 DIAGNOSIS — R300 Dysuria: Secondary | ICD-10-CM

## 2015-12-26 LAB — POCT URINALYSIS DIPSTICK
Bilirubin, UA: NEGATIVE
Glucose, UA: NEGATIVE
Ketones, UA: NEGATIVE
NITRITE UA: POSITIVE
PH UA: 5.5
UROBILINOGEN UA: NEGATIVE

## 2015-12-26 MED ORDER — PHENAZOPYRIDINE HCL 200 MG PO TABS: 200.0000 mg | ORAL_TABLET | Freq: Three times a day (TID) | ORAL | 0 refills | Status: DC | PRN

## 2015-12-26 MED ORDER — NITROFURANTOIN MONOHYD MACRO 100 MG PO CAPS: 100.0000 mg | ORAL_CAPSULE | Freq: Two times a day (BID) | ORAL | 0 refills | Status: DC

## 2015-12-26 NOTE — Progress Notes (Signed)
Patient ID: Isabel Thomas, female   DOB: Apr 14, 1970, 45 y.o.   MRN: [id number]  45 y.o.Married Caucasian female G0P0000 here with complaint of UTI, with onset last week, used Azo and symptoms got better. Patient complaining of:  dysuria, urinary frequency and urinary urgency. Patient denies fever, chills, nausea or back pain. No new personal products. Patient feels that symptoms are related to sexual activity. Denies vaginal symptoms.    Contraception is POP.  Some vaginal dryness. Patient has adequate water intake   O: Healthy female WDWN Affect: Normal, orientation x 3 Skin : warm and dry CVAT: negative bilateral Abdomen: negative for suprapubic tenderness   POCT:  Moderate blood, 2+ protein, + nitrate, moderate leuk's  A:  R/O UTI    P:  Reviewed findings of UTI and need for treatment. Rx:  Will start on Macrobid  Lab: Urine micro, culture Reviewed warning signs and symptoms of UTI and need to advise if occurring. Encouraged to limit soda, tea, and coffee   RV prn

## 2015-12-26 NOTE — Patient Instructions (Signed)
Urinary Tract Infection, Adult Introduction A urinary tract infection (UTI) is an infection of any part of the urinary tract. The urinary tract includes the:  Kidneys.  Ureters.  Bladder.  Urethra. These organs make, store, and get rid of pee (urine) in the body. Follow these instructions at home:  Take over-the-counter and prescription medicines only as told by your doctor.  If you were prescribed an antibiotic medicine, take it as told by your doctor. Do not stop taking the antibiotic even if you start to feel better.  Avoid the following drinks:  Alcohol.  Caffeine.  Tea.  Carbonated drinks.  Drink enough fluid to keep your pee clear or pale yellow.  Keep all follow-up visits as told by your doctor. This is important.  Make sure to:  Empty your bladder often and completely. Do not to hold pee for long periods of time.  Empty your bladder before and after sex.  Wipe from front to back after a bowel movement if you are female. Use each tissue one time when you wipe. Contact a doctor if:  You have back pain.  You have a fever.  You feel sick to your stomach (nauseous).  You throw up (vomit).  Your symptoms do not get better after 3 days.  Your symptoms go away and then come back. Get help right away if:  You have very bad back pain.  You have very bad lower belly (abdominal) pain.  You are throwing up and cannot keep down any medicines or water. This information is not intended to replace advice given to you by your health care provider. Make sure you discuss any questions you have with your health care provider. Document Released: 06/19/2007 Document Revised: 06/08/2015 Document Reviewed: 11/21/2014  2017 Elsevier  

## 2015-12-28 LAB — URINE CULTURE

## 2015-12-28 NOTE — Progress Notes (Signed)
Encounter reviewed by Dr. Brook Amundson C. Silva.  

## 2016-01-02 ENCOUNTER — Telehealth: Payer: Self-pay | Admitting: Nurse Practitioner

## 2016-01-02 MED ORDER — CIPROFLOXACIN HCL 500 MG PO TABS: 500.0000 mg | ORAL_TABLET | Freq: Two times a day (BID) | ORAL | 0 refills | Status: AC

## 2016-01-02 MED ORDER — PHENAZOPYRIDINE HCL 200 MG PO TABS: 200.0000 mg | ORAL_TABLET | Freq: Three times a day (TID) | ORAL | 0 refills | Status: DC | PRN

## 2016-01-02 NOTE — Telephone Encounter (Signed)
Kem Boroughs, NP -can you clarify dispense amt for cipro? 7 days?

## 2016-01-02 NOTE — Telephone Encounter (Signed)
She has E coli and even though Macrobid was a good choice maybe not hitting the infection very good.  Lets try changing to Cipro 500 mg BID since she is having more pain issues in the back and abdomen.  As far as the vaginal discharge - have her to use OTC Monistat if she feels this is yeast.

## 2016-01-02 NOTE — Telephone Encounter (Signed)
Patient finished antibiotics but is still having symptoms and would like a refill of the prescription Azo called in.

## 2016-01-02 NOTE — Telephone Encounter (Signed)
Spoke with Kem Boroughs, NP, clarified Cipro 500 mg bid x 5 days. Order placed.   Spoke with patient, advised as seen below per Kem Boroughs, NP. Patient verbalizes understanding and is agreeable.  Routing to provider for final review. Patient is agreeable to disposition. Will close encounter.

## 2016-01-02 NOTE — Telephone Encounter (Signed)
She may have Pyridium 200 mg tid prn # 10

## 2016-01-02 NOTE — Telephone Encounter (Signed)
Spoke with patient. Patient states she completed antibiotic and prescription azo this morning. Patient states she was seen in office 12/12. Patient states she is still having lower back and abdominal pain, urinary frequency and urgency with only few drops of urine, yellow/blood tinged vaginal discharge with odor. Patient reports odor as new. Denies nausea, fever. Patient states she does not know whether or not the lower back and abdominal pain and spotting is from her cycle as it is irregular and very light due to the OCP she takes. Patient reports cycle every 4-5 months and very light. Advised patient will review with Kem Boroughs, NP for recommendations and return call. Patient is agreeable.  Kem Boroughs, NP -please advise?

## 2016-01-02 NOTE — Telephone Encounter (Signed)
Spoke with patient, advised as seen below per Kem Boroughs, NP. Patient asked if she would need additional prescription for Azo? Advised patient azo could be purchased OTC and advised patient azo will not treat infection, is for symptom relief. Patient states she is aware and would like prescription strength again if possible? Advised will review with Kem Boroughs, NP And return call for any additional recommendations. Patient verbalizes understanding and is agreeable.   Kem Boroughs, NP -can you verify how much Cipro  500 mg bid to dispense?

## 2016-05-24 ENCOUNTER — Ambulatory Visit (INDEPENDENT_AMBULATORY_CARE_PROVIDER_SITE_OTHER): Payer: BLUE CROSS/BLUE SHIELD | Admitting: Nurse Practitioner

## 2016-05-24 ENCOUNTER — Encounter: Payer: Self-pay | Admitting: Nurse Practitioner

## 2016-05-24 VITALS — BP 108/66 | HR 56 | Ht 59.25 in | Wt 111.0 lb

## 2016-05-24 DIAGNOSIS — N912 Amenorrhea, unspecified: Secondary | ICD-10-CM | POA: Diagnosis not present

## 2016-05-24 DIAGNOSIS — Z113 Encounter for screening for infections with a predominantly sexual mode of transmission: Secondary | ICD-10-CM

## 2016-05-24 DIAGNOSIS — Z0000 Encounter for general adult medical examination without abnormal findings: Secondary | ICD-10-CM

## 2016-05-24 DIAGNOSIS — Z01419 Encounter for gynecological examination (general) (routine) without abnormal findings: Secondary | ICD-10-CM | POA: Diagnosis not present

## 2016-05-24 MED ORDER — NORETHINDRONE 0.35 MG PO TABS: 1.0000 | ORAL_TABLET | Freq: Every day | ORAL | 4 refills | Status: DC

## 2016-05-24 NOTE — Patient Instructions (Signed)

## 2016-05-24 NOTE — Progress Notes (Signed)
Patient ID: Isabel Thomas, female   DOB: 1970-12-22, 46 y.o.   MRN: [id number]  46 y.o. G0P0000 Separated Caucasian Fe here for annual exam.  With POP rarely gets spotting for 2-3 days every few months.  No vaso symptoms. Still has HA's  continuous that vary in intensity.  She has a new partner since 12/17.  She and husband are going to pursue divorce.  Patient's last menstrual period was 05/14/2016 (approximate).          Sexually active: Yes.    The current method of family planning is vasectomy.    Exercising: Yes.    yoga and walking Smoker:  no  Health Maintenance: Pap: 05/16/14, Negative with neg HR HPV  04/18/11, Negative with neg HR HPV History of Abnormal Pap: no MMG: 01/31/15, 3D-no, Density Category C, Bi-Rads 1:  Negative Self Breast exams: sometimes Colonoscopy: 07/29/08, Normal, repeat at age 72 (2022) TDaP: ? HIV: 1995 Labs: done and will go to [place]   reports that she quit smoking about 17 months ago. She has a 11.00 pack-year smoking history. She has never used smokeless tobacco. She reports that she does not drink alcohol or use drugs.  Past Medical History:  Diagnosis Date  . Arnold-Chiari syndrome ([place]) 2000   cause of Headaches  . Fibromyalgia syndrome 2008  . Pineal gland cyst 2001   will be having surgery in 2015?    Past Surgical History:  Procedure Laterality Date  . CHOLECYSTECTOMY, LAPAROSCOPIC  10/05  . INTRAUTERINE DEVICE INSERTION  09/13/96, removed 4/09   paraguard  . KNEE ARTHROSCOPY Right 11/11   with lateral release    Current Outpatient Prescriptions  Medication Sig Dispense Refill  . amiloride-hydrochlorothiazide (MODURETIC) 5-50 MG tablet Take 1 tablet by mouth daily.  3  . CAMILA 0.35 MG tablet TAKE 1 TABLET BY MOUTH EVERY DAY 84 tablet 4  . clonazePAM (KLONOPIN) 1 MG tablet Take 1 mg by mouth at bedtime as needed.  5  . fluticasone (FLONASE) 50 MCG/ACT nasal spray Place 2 sprays into the nose daily as needed for allergies.     .  hydrocortisone (ANUSOL-HC) 25 MG suppository Place 25 mg rectally 2 (two) times daily as needed for hemorrhoids or itching.     . levothyroxine (SYNTHROID, LEVOTHROID) 88 MCG tablet Take 88 mcg by mouth daily before breakfast.    . morphine (KADIAN) 80 MG 24 hr capsule Take 80 mg by mouth daily.  0  . NUCYNTA ER 100 MG TB12 Take 1 tablet by mouth 2 (two) times daily.  0  . oxyCODONE (OXY IR/ROXICODONE) 5 MG immediate release tablet 5 mg as needed.    . temazepam (RESTORIL) 15 MG capsule Take 15-30 mg by mouth at bedtime as needed for sleep.     No current facility-administered medications for this visit.     Family History  Problem Relation Age of Onset  . Cancer Maternal Grandmother 63       colon  . Cancer Mother 29       thyroid  . Thyroid disease Mother   . Thyroid disease Father   . Colon cancer Maternal Uncle 81    ROS:  Pertinent items are noted in HPI.  Otherwise, a comprehensive ROS was negative.  Exam:   BP 108/66 (BP Location: Right Arm, Patient Position: Sitting, Cuff Size: Normal)   Pulse (!) 56   Ht 4' 11.25" (1.505 m)   Wt 111 lb (50.3 kg)   LMP 05/14/2016 (Approximate)  BMI 22.23 kg/m  Height: 4' 11.25" (150.5 cm) Ht Readings from Last 3 Encounters:  05/24/16 4' 11.25" (1.505 m)  12/26/15 4' 11.25" (1.505 m)  05/24/15 4' 11.25" (1.505 m)    General appearance: alert, cooperative and appears stated age Head: Normocephalic, without obvious abnormality, atraumatic Neck: no adenopathy, supple, symmetrical, trachea midline and thyroid normal to inspection and palpation Lungs: clear to auscultation bilaterally Breasts: normal appearance, no masses or tenderness Heart: regular rate and rhythm Abdomen: soft, non-tender; no masses,  no organomegaly Extremities: extremities normal, atraumatic, no cyanosis or edema Skin: Skin color, texture, turgor normal. No rashes or lesions Lymph nodes: Cervical, supraclavicular, and axillary nodes normal. No abnormal inguinal  nodes palpated Neurologic: Grossly normal   Pelvic: External genitalia:  no lesions              Urethra:  normal appearing urethra with no masses, tenderness or lesions              Bartholin's and Skene's: normal                 Vagina: normal appearing vagina with normal color and discharge, no lesions              Cervix: anteverted              Pap taken: No. Bimanual Exam:  Uterus:  normal size, contour, position, consistency, mobility, non-tender              Adnexa: no mass, fullness, tenderness               Rectovaginal: Confirms               Anus:  normal sphincter tone, no lesions  Chaperone present: yes  A:  Well Woman with normal exam  Contraception with POP- usually amenorrhea on POP History of migraine headaches History of Pineal cyst, fibromyalgia             Marital discord and has sought counseling - now separated  R/O STD's   P:   Reviewed health and wellness pertinent to exam  Pap smear: no  Mammogram is due now and she will call for apt.  Refill POP for a year  Pt is sent to [place] for labs  Counseled on breast self exam, mammography screening, use and side effects of POP's, adequate intake of calcium and vitamin D, diet and exercise return annually or prn  An After Visit Summary was printed and given to the patient.

## 2016-05-24 NOTE — Progress Notes (Signed)
Reviewed personally.  M. Suzanne Jeneva Schweizer, MD.  

## 2016-05-26 LAB — GC/CHLAMYDIA PROBE AMP
CHLAMYDIA, DNA PROBE: NEGATIVE
Neisseria gonorrhoeae by PCR: NEGATIVE

## 2016-08-14 ENCOUNTER — Telehealth: Payer: Self-pay | Admitting: Obstetrics and Gynecology

## 2016-08-14 NOTE — Telephone Encounter (Signed)
Left message on voicemail to call and reschedule cancelled appointment. Mail letter °

## 2017-05-26 ENCOUNTER — Ambulatory Visit: Payer: BLUE CROSS/BLUE SHIELD | Admitting: Nurse Practitioner

## 2017-06-02 ENCOUNTER — Telehealth: Payer: Self-pay | Admitting: *Deleted

## 2017-06-02 NOTE — Telephone Encounter (Signed)
Patient left voicemail returning call to [place].

## 2017-06-02 NOTE — Telephone Encounter (Signed)
Message left to return call to [facility] at [phone].   Patient needs to schedule aex.

## 2017-06-02 NOTE — Telephone Encounter (Signed)
Fax request received from [facility] [address] for the following:   Medication refill request: camila Last AEX:  05/24/16 PG Next AEX: 07/15/17 DL  Last MMG (if hormonal medication request): 01/31/15 BIRADS 1 negative  Refill authorized: 05/24/16 #84, 4 RF. Today, please advise.   Spoke with patient. Patient scheduled for annual on 07/15/17 with Melvia Heaps, CNM. Patient agreeable to date and time of appointment.

## 2017-06-03 MED ORDER — NORETHINDRONE 0.35 MG PO TABS: 1.0000 | ORAL_TABLET | Freq: Every day | ORAL | 0 refills | Status: DC

## 2017-06-03 NOTE — Telephone Encounter (Signed)
Isabel Thomas #[id number]/0RF [facility] to [facility].   Left detailed message, ok per dpr. Advised of refill, keep AEX for future refills. Return call to office if any additional questions. Encounter closed.

## 2017-06-03 NOTE — Telephone Encounter (Signed)
Ok to refill 

## 2017-07-15 ENCOUNTER — Ambulatory Visit: Payer: BLUE CROSS/BLUE SHIELD | Admitting: Certified Nurse Midwife

## 2017-07-30 ENCOUNTER — Telehealth: Payer: Self-pay | Admitting: Certified Nurse Midwife

## 2017-07-30 NOTE — Telephone Encounter (Signed)
Left message regarding upcoming appointment has been canceled and needs to be rescheduled. °

## 2017-08-23 ENCOUNTER — Other Ambulatory Visit: Payer: Self-pay | Admitting: Certified Nurse Midwife

## 2017-08-25 NOTE — Telephone Encounter (Signed)
Medication refill request: Camilla Last AEX:  05/24/2015 Next AEX: no scheduled Last MMG (if hormonal medication request): 01/31/2015 Refill authorized: #[id number], 0 refills, please advise

## 2017-08-25 NOTE — Telephone Encounter (Signed)
Patient has no aex scheduled, no refill until she has aex

## 2017-08-27 ENCOUNTER — Other Ambulatory Visit: Payer: Self-pay | Admitting: Obstetrics and Gynecology

## 2017-08-27 ENCOUNTER — Ambulatory Visit (INDEPENDENT_AMBULATORY_CARE_PROVIDER_SITE_OTHER): Payer: Medicare HMO | Admitting: Obstetrics and Gynecology

## 2017-08-27 ENCOUNTER — Other Ambulatory Visit: Payer: Self-pay

## 2017-08-27 ENCOUNTER — Encounter: Payer: Self-pay | Admitting: Obstetrics and Gynecology

## 2017-08-27 ENCOUNTER — Other Ambulatory Visit (HOSPITAL_COMMUNITY)
Admission: RE | Admit: 2017-08-27 | Discharge: 2017-08-27 | Disposition: A | Discharge status: Home / Self Care | Payer: Medicare HMO | Source: Ambulatory Visit | Attending: Obstetrics and Gynecology | Admitting: Obstetrics and Gynecology

## 2017-08-27 VITALS — BP 100/80 | HR 64 | Resp 16 | Ht 59.25 in | Wt 102.6 lb

## 2017-08-27 DIAGNOSIS — Z1231 Encounter for screening mammogram for malignant neoplasm of breast: Secondary | ICD-10-CM

## 2017-08-27 DIAGNOSIS — Z01419 Encounter for gynecological examination (general) (routine) without abnormal findings: Secondary | ICD-10-CM

## 2017-08-27 DIAGNOSIS — Z113 Encounter for screening for infections with a predominantly sexual mode of transmission: Secondary | ICD-10-CM

## 2017-08-27 DIAGNOSIS — Z1211 Encounter for screening for malignant neoplasm of colon: Secondary | ICD-10-CM | POA: Diagnosis not present

## 2017-08-27 MED ORDER — NORETHINDRONE 0.35 MG PO TABS: 1.0000 | ORAL_TABLET | Freq: Every day | ORAL | 0 refills | Status: DC

## 2017-08-27 NOTE — Progress Notes (Signed)
47 y.o. G0P0000 Legally Separated Caucasian female here for annual exam.    On POPs.  Generally does not have her menses.  Really feels hormonally stable on these pills.   Disabled due to a pineal cyst of 10 years duration. Chiari malformation.  Has headaches 24/7. Had spinal tabs to reduce pressure.  Taking medication to control pain.  Has a lot of dizziness and falls.   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  PCP:  Juanita Craver, MD   Patient's last menstrual period was 08/20/2017.           Sexually active: Yes.    The current method of family planning is oral progesterone-only contraceptive.    Exercising: Yes.    yoga,walking, some resistance training  Smoker:  yes  Health Maintenance: Pap:  05/16/14 neg. HR HPV:neg   04/12/08 Neg  History of abnormal Pap:  no MMG:  01/31/15 BIRADS1:Neg  Colonoscopy:  07/29/08 Normal. F/u age 10 BMD:   Never TDaP: Unsure HIV: done Hep C: done Screening Labs: PCP   reports that she quit smoking about 2 years ago. She has a 11.00 pack-year smoking history. She has never used smokeless tobacco. She reports that she does not drink alcohol or use drugs.  Past Medical History:  Diagnosis Date  . Arnold-Chiari syndrome ([place]) 2000   cause of Headaches  . Fibromyalgia syndrome 2008  . Pineal gland cyst 2001   will be having surgery in 2015?    Past Surgical History:  Procedure Laterality Date  . CHOLECYSTECTOMY, LAPAROSCOPIC  10/05  . INTRAUTERINE DEVICE INSERTION  09/13/96, removed 4/09   paraguard  . KNEE ARTHROSCOPY Right 11/11   with lateral release    Current Outpatient Medications  Medication Sig Dispense Refill  . aMILoride (MIDAMOR) 5 MG tablet Take 5 mg by mouth daily.    . clonazePAM (KLONOPIN) 1 MG tablet Take 1 mg by mouth at bedtime as needed.  5  . diphenhydrAMINE (BENADRYL) 50 MG capsule Take by mouth daily as needed.    . fluticasone (FLONASE) 50 MCG/ACT nasal spray Place 2  sprays into the nose daily as needed for allergies.     . hydrochlorothiazide (HYDRODIURIL) 50 MG tablet Take 50 mg by mouth daily.    . hydrocortisone (ANUSOL-HC) 25 MG suppository Place 25 mg rectally 2 (two) times daily as needed for hemorrhoids or itching.     Marland Kitchen ibuprofen (ADVIL,MOTRIN) 200 MG tablet Take by mouth daily as needed.    Marland Kitchen levothyroxine (SYNTHROID, LEVOTHROID) 88 MCG tablet Take 88 mcg by mouth daily before breakfast.    . Magnesium Oxide 500 MG TABS Take by mouth daily as needed.    Marland Kitchen morphine (KADIAN) 80 MG 24 hr capsule Take 80 mg by mouth daily.  0  . norethindrone (CAMILA) 0.35 MG tablet Take 1 tablet (0.35 mg total) by mouth daily. 84 tablet 0  . oxyCODONE (OXY IR/ROXICODONE) 5 MG immediate release tablet 5 mg as needed.    . temazepam (RESTORIL) 15 MG capsule Take 15-30 mg by mouth at bedtime as needed for sleep.     No current facility-administered medications for this visit.     Family History  Problem Relation Age of Onset  . Cancer Maternal Grandmother 28       colon  . Cancer Mother 47       thyroid  . Thyroid disease Mother   . Thyroid disease Father   .  Colon cancer Maternal Uncle 67    Review of Systems  Skin:       Cyst - right buttocks   Neurological: Positive for headaches.  All other systems reviewed and are negative.   Exam:   BP 100/80 (BP Location: Right Arm, Patient Position: Sitting, Cuff Size: Normal)   Pulse 64   Resp 16   Ht 4' 11.25" (1.505 m)   Wt 102 lb 9.6 oz (46.5 kg)   LMP 08/20/2017   BMI 20.55 kg/m     General appearance: alert, cooperative and appears stated age Head: Normocephalic, without obvious abnormality, atraumatic Neck: no adenopathy, supple, symmetrical, trachea midline and thyroid normal to inspection and palpation Lungs: clear to auscultation bilaterally Breasts: normal appearance, no masses or tenderness, No nipple retraction or dimpling, No nipple discharge or bleeding, No axillary or supraclavicular  adenopathy Heart: regular rate and rhythm Abdomen: soft, non-tender; no masses, no organomegaly Extremities: extremities normal, atraumatic, no cyanosis or edema Skin: Skin color, texture, turgor normal. No rashes or lesions Lymph nodes: Cervical, supraclavicular, and axillary nodes normal. No abnormal inguinal nodes palpated Neurologic: Grossly normal  Pelvic: External genitalia:  no lesions              Urethra:  normal appearing urethra with no masses, tenderness or lesions              Bartholins and Skenes: normal                 Vagina: normal appearing vagina with normal color and discharge, no lesions              Cervix: no lesions              Pap taken: Yes.   Bimanual Exam:  Uterus:  normal size, contour, position, consistency, mobility, non-tender              Adnexa: no mass, fullness, tenderness              Rectal exam: Yes.  .  Confirms.              Anus:  normal sphincter tone, no lesions  Chaperone was present for exam.  Assessment:   Well woman visit with normal exam. Disability due to pineal cyst and chronic HA with migraines.   Plan: Mammogram screening. Recommended self breast awareness. Pap and HR HPV as above. Guidelines for Calcium, Vitamin D, regular exercise program including cardiovascular and weight bearing exercise. Refill POP for one month until mammogram back and normal. IFOB.  Follow up annually and prn.   After visit summary provided.

## 2017-08-27 NOTE — Patient Instructions (Signed)

## 2017-08-27 NOTE — Progress Notes (Signed)
Patient scheduled while in office for screening MMG on 09/24/17 at 1:20pm at [place]. Patient verbalizes understanding and is agreeable.

## 2017-08-29 LAB — CYTOLOGY - PAP
Chlamydia: NEGATIVE
DIAGNOSIS: NEGATIVE
NEISSERIA GONORRHEA: NEGATIVE
TRICH (WINDOWPATH): NEGATIVE

## 2017-08-29 LAB — HEP, RPR, HIV PANEL
HIV SCREEN 4TH GENERATION: NONREACTIVE
Hepatitis B Surface Ag: NEGATIVE
RPR Ser Ql: NONREACTIVE

## 2017-08-29 LAB — HEPATITIS C ANTIBODY

## 2017-09-03 LAB — SPECIMEN STATUS REPORT

## 2017-09-03 LAB — FECAL OCCULT BLOOD, IMMUNOCHEMICAL: Fecal Occult Bld: NEGATIVE

## 2017-09-17 ENCOUNTER — Ambulatory Visit (INDEPENDENT_AMBULATORY_CARE_PROVIDER_SITE_OTHER): Payer: Medicare HMO | Admitting: Certified Nurse Midwife

## 2017-09-17 ENCOUNTER — Encounter: Payer: Self-pay | Admitting: Certified Nurse Midwife

## 2017-09-17 ENCOUNTER — Other Ambulatory Visit: Payer: Self-pay

## 2017-09-17 VITALS — BP 90/60 | HR 64 | Resp 16 | Ht 59.25 in | Wt 103.0 lb

## 2017-09-17 DIAGNOSIS — L731 Pseudofolliculitis barbae: Secondary | ICD-10-CM

## 2017-09-17 DIAGNOSIS — D1801 Hemangioma of skin and subcutaneous tissue: Secondary | ICD-10-CM

## 2017-09-17 NOTE — Progress Notes (Signed)
  Subjective:     Patient ID: Isabel Thomas, female   DOB: [birth year], 47 y.o.   MRN: [id number]  47 yo white female here with complaint of two small ? Bites or lesions on left inside labia, uncomfortable , but no itching. Noted about 4 days ago and they have not changed.. Denies bleeding or scabbing from area. Denies any new personal products or swimming or camping recently. Does walk in the woods. Has pets, cats, but indoors. Patient had sore throat over weekend ? Virus, but feels better now, does not feel related.. Also has noted  ? sebaceous cyst on right buttock. Has applied heal all salve and would like checked. Shaves in buttock area due to excessive hair. No concerns about STD's. No other concerns.    Review of Systems  Constitutional: Negative for activity change, appetite change, chills and fever.       Over the weekend only, better now.  HENT: Negative.   Eyes: Negative.   Respiratory: Negative.   Cardiovascular: Negative.   Gastrointestinal: Negative.   Endocrine: Negative.   Genitourinary: Positive for genital sores. Negative for pelvic pain, vaginal bleeding, vaginal discharge and vaginal pain.       On inner vulva area, ? bite  Musculoskeletal: Negative.   Skin: Negative.   Allergic/Immunologic: Negative.   Neurological: Negative.   Hematological: Negative.   Psychiatric/Behavioral: Negative.        Objective:   Physical Exam  Constitutional: She is oriented to person, place, and time. She appears well-developed and well-nourished.  Genitourinary: Vagina normal.    Pelvic exam was performed with patient supine. There is no rash or tenderness on the right labia. There is no rash or tenderness on the left labia.  Genitourinary Comments: ? Exercise injury to inside of labia, no lesions noted. Appears to be fading, non tender to touch    Lymphadenopathy: No inguinal adenopathy noted on the right or left side.  Neurological: She is alert and oriented to person,  place, and time.  Skin: Skin is warm and dry.  Psychiatric: She has a normal mood and affect. Her behavior is normal. Judgment and thought content normal.       Assessment:     Normal limited pelvic exam Tiny hemangioma x 4 on left inner labia, possible injury from exercise, 2 appear to be resolving Ingrown hair on right buttock, not infected from shaving    Plan:     Discussed findings with patient and shown all areas to patient with mirror. Discussed benign finding on inside of labia. Discussed ingrown hair and epsom salt tub bath and massage of area should clear. Avoid squeezing area. Should resolve quickly. Warning signs given and will need to be rechecked.  Questions addressed.  Rv prn

## 2017-09-17 NOTE — Patient Instructions (Signed)
Ingrown Hair An ingrown hair is a hair that curls and re-enters the skin instead of growing straight out of the skin. An ingrown hair can develop in any part of the skin that hair is removed from. An ingrown hair may cause small pockets of infection. What are the causes? An ingrown hair can be caused by:  Shaving.  Tweezing.  Waxing.  Using a hair removal cream.  What increases the risk? Ingrown hairs are more likely to develop in people who have curly hair. What are the signs or symptoms? Symptoms of an ingrown hair may include:  Small bumps on the skin. The bumps may be filled with pus.  Pain.  Itching.  How is this diagnosed? An ingrown hair is diagnosed with a skin exam. How is this treated? Treatment is often not needed unless the ingrown hair has caused an infection. Treatment may involve:  Applying prescription creams to the skin. This can help with inflammation.  Applying warm compresses to the skin. This can help soften the skin.  Taking antibiotic medicine. An antibiotic may be prescribed if the infection is severe.  Follow these instructions at home:  Do not shave irritated areas of skin. You may start shaving these areas again once the irritation has gone away.  Take, apply, or use over-the-counter and prescription medicines only as told by your health care provider. This includes any prescription creams.  If you were prescribed an antibiotic medicine, take it as told by your health care provider. Do not stop taking the antibiotic even if your condition improves.  To help remove ingrown hairs on your face, you may use a facial sponge in a gentle circular motion.  If directed, apply heat to the affected area. Use the heat source that your health care provider recommends, such as a moist heat pack or a heating pad. ? Place a towel between your skin and the heat source. ? Leave the heat on for 20-30 minutes. ? Remove the heat if your skin turns bright red.  This is especially important if you are unable to feel pain, heat, or cold. You may have a greater risk of getting burned. How is this prevented?  Shower before shaving.  Wrap areas that you are going to shave in warm, moist wraps for several minutes before shaving. The warmth and moisture helps to soften the hairs and makes ingrown hairs less likely.  Use thick shaving gels.  Use a razor that cuts hair slightly above your skin. Or, use an electric shaver with a long shave setting.  Shave in the direction of hair growth.  Avoid making multiple razor strokes.  Apply a moisturizing lotion after shaving. This information is not intended to replace advice given to you by your health care provider. Make sure you discuss any questions you have with your health care provider. Document Released: 04/08/2000 Document Revised: 07/21/2015 Document Reviewed: 10/21/2014 Elsevier Interactive Patient Education  2018 [employer]  

## 2017-09-24 ENCOUNTER — Ambulatory Visit
Admission: RE | Admit: 2017-09-24 | Discharge: 2017-09-24 | Disposition: A | Discharge status: Home / Self Care | Payer: Medicare HMO | Source: Ambulatory Visit | Attending: Obstetrics and Gynecology | Admitting: Obstetrics and Gynecology

## 2017-09-24 ENCOUNTER — Ambulatory Visit: Payer: BLUE CROSS/BLUE SHIELD | Admitting: Certified Nurse Midwife

## 2017-09-24 DIAGNOSIS — Z1231 Encounter for screening mammogram for malignant neoplasm of breast: Secondary | ICD-10-CM

## 2017-09-26 ENCOUNTER — Other Ambulatory Visit: Payer: Self-pay | Admitting: Obstetrics and Gynecology

## 2017-09-29 NOTE — Telephone Encounter (Signed)
Medication refill request: micronor Last AEX:  08/27/2017 Next AEX: 09/02/2018 Last MMG (if hormonal medication request): 09/2017 BI-RADS CATEGORY  1: Negative. Refill authorized: #[id number], 3 refills.

## 2017-10-25 IMAGING — MR MR HEAD WO/W CM
12 of 13 series · 45 of 48 positions shown · IV contrast (9ml multihance)
Comparison: 01/16/2007.  01/30/2005.

CLINICAL DATA: Constant head and neck pain. Right upper and lower
extremity weakness. Personal history of pineal cyst in Chiari
malformation.

EXAM:
MRI HEAD WITHOUT AND WITH CONTRAST
TECHNIQUE: Multiplanar, multiecho pulse sequences of the brain and surrounding
structures were obtained without and with intravenous contrast.
CONTRAST:  9mL MULTIHANCE GADOBENATE DIMEGLUMINE 529 MG/ML IV SOLN

[Series 2: T1 · sagittal · 5.0mm · 0.45mm/px · 1 of 21 slices shown]
[im 1/21]
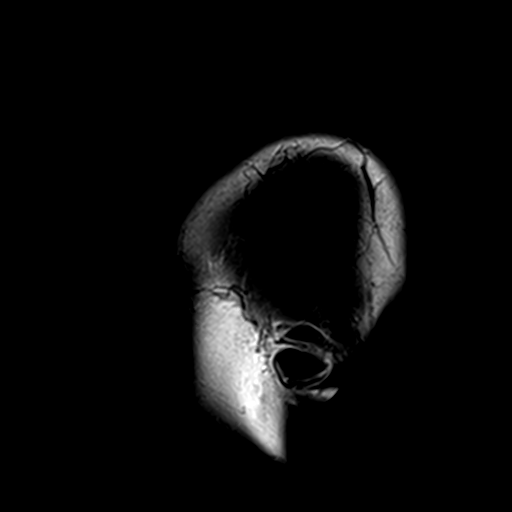

[Series 3: DWI · axial · 3.0mm · 1.80mm/px · z∈[-76,+69]mm · 7 of 100 slices shown (1 of 4)]
[im 1/100]
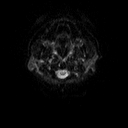
[im 17/100]
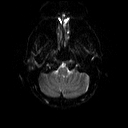
[im 34/100]
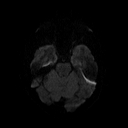
[im 50/100]
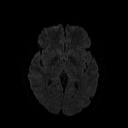
[im 67/100]
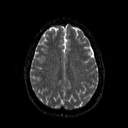
[im 83/100]
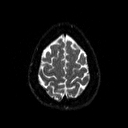
[im 100/100]
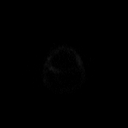

[Series 4: DWI · axial · 3.0mm · 1.80mm/px · z∈[-76,+69]mm · 4 of 50 slices shown (2 of 4)]
[im 1/50]
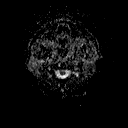
[im 17/50]
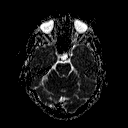
[im 33/50]
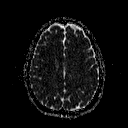
[im 50/50]
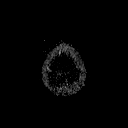

[Series 6: swi_images · axial · 2.0mm · 0.90mm/px · z∈[-82,+74]mm · 6 of 80 slices shown]
[im 1/80]
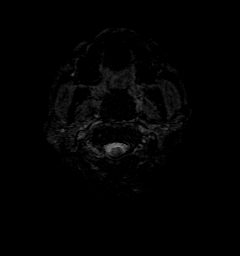
[im 16/80]
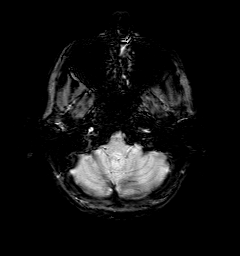
[im 32/80]
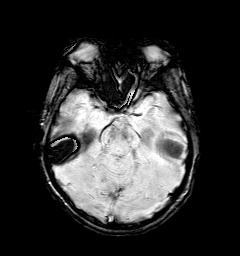
[im 48/80]
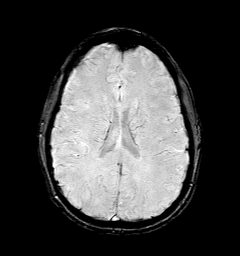
[im 64/80]
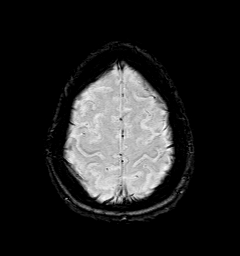
[im 80/80]
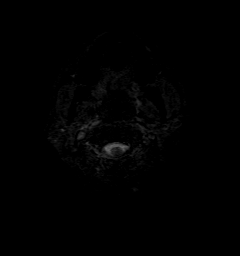

[Series 7: DWI · coronal · 5.0mm · 1.80mm/px · 5 of 67 slices shown (3 of 4)]
[im 1/67]
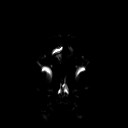
[im 17/67]
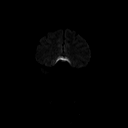
[im 34/67]
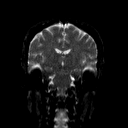
[im 50/67]
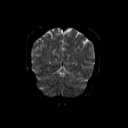
[im 67/67]
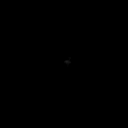

[Series 8: DWI · coronal · 5.0mm · 1.80mm/px · 3 of 34 slices shown (4 of 4)]
[im 1/34]
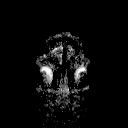
[im 17/34]
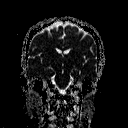
[im 34/34]
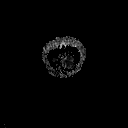

[Series 9: T2 · axial · 5.0mm · 0.51mm/px · z∈[-85,+66]mm · 2 of 24 slices shown (1 of 2)]
[im 1/24]
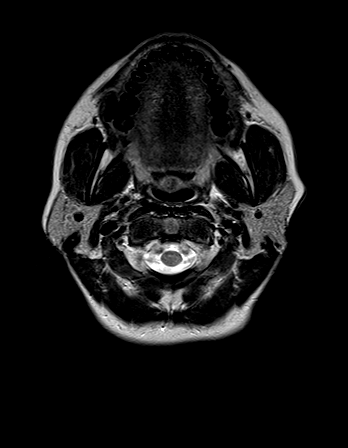
[im 24/24]
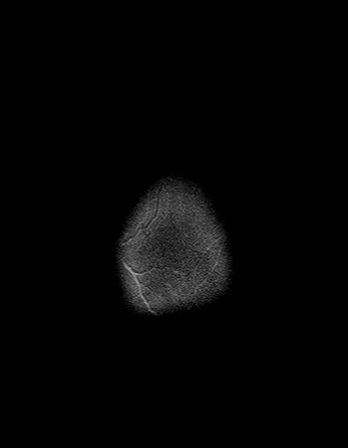

[Series 10: FLAIR · axial · 5.0mm · 0.45mm/px · z∈[-85,+66]mm · 2 of 24 slices shown]
[im 1/24]
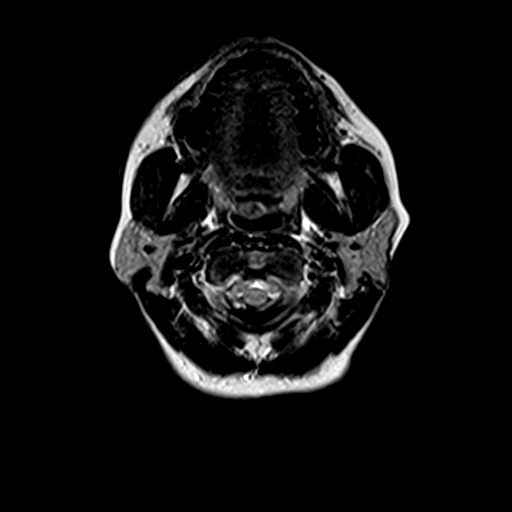
[im 24/24]
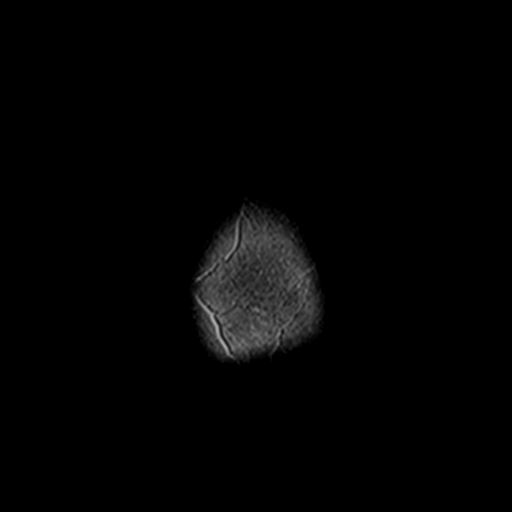

[Series 11: t1_mpr_tra · axial · 2.0mm · 0.45mm/px · z∈[-87,+68]mm · 6 of 80 slices shown (1 of 2)]
[im 1/80]
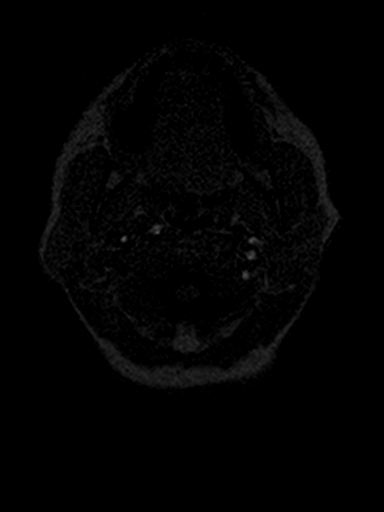
[im 16/80]
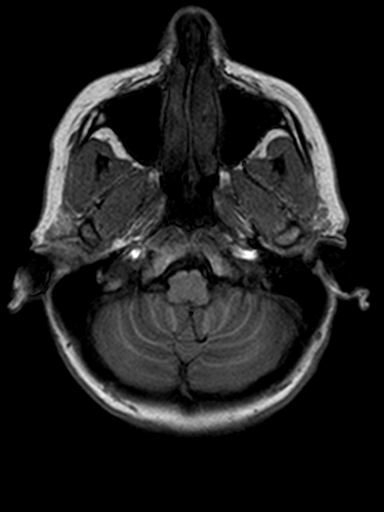
[im 32/80]
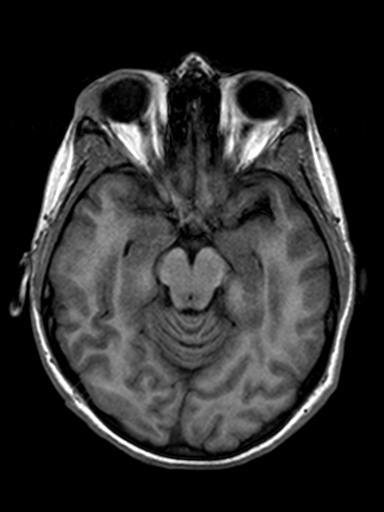
[im 48/80]
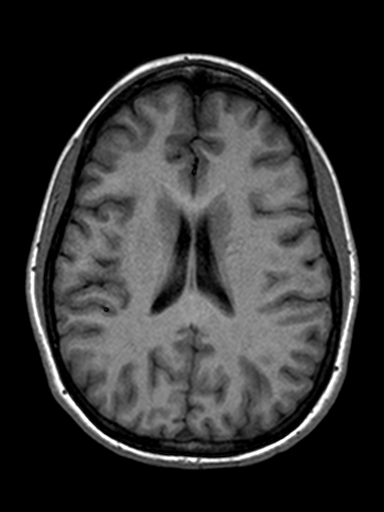
[im 64/80]
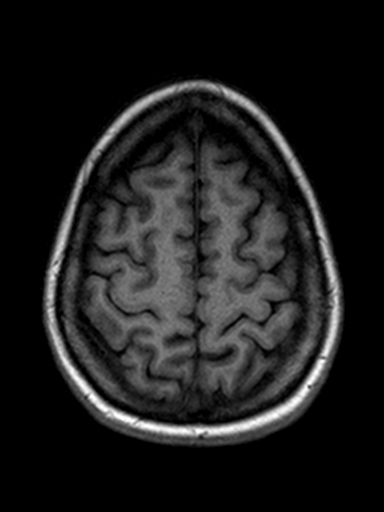
[im 80/80]
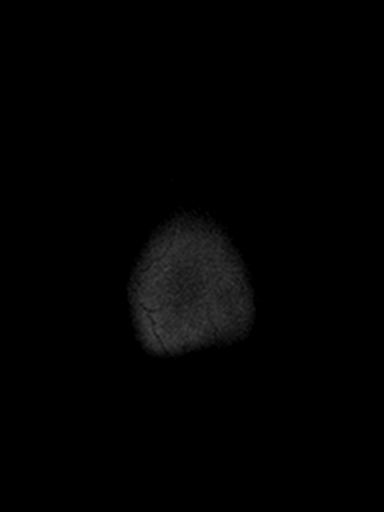

[Series 12: T2 · coronal · 5.0mm · 0.45mm/px · 2 of 26 slices shown (2 of 2)]
[im 1/26]
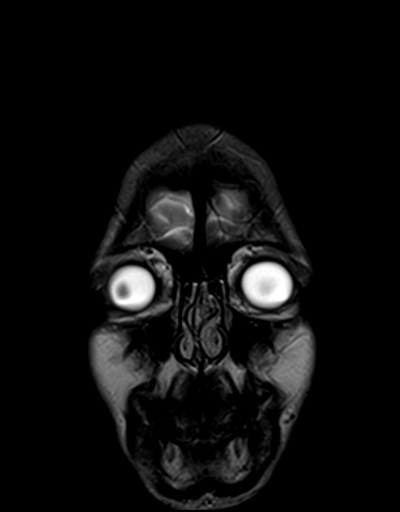
[im 26/26]
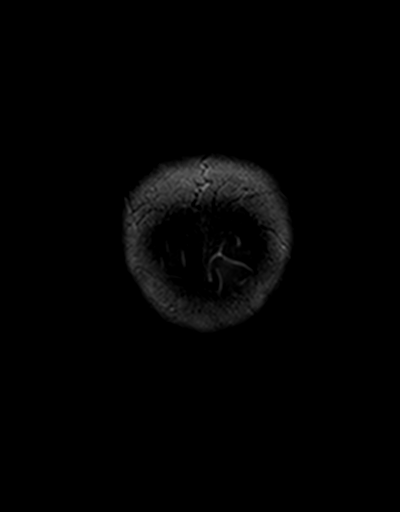

[Series 13: t1_mpr_tra · axial · 2.0mm · 0.45mm/px · z∈[-87,+68]mm · 6 of 79 slices shown (2 of 2)]
[im 1/79]
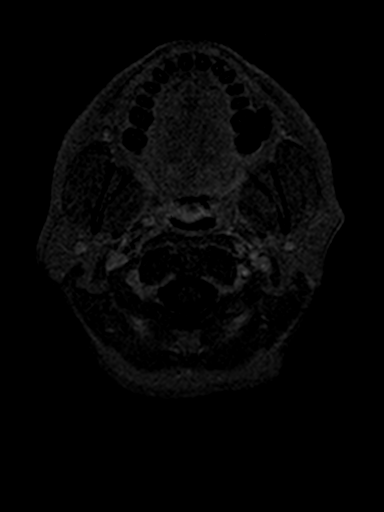
[im 16/79]
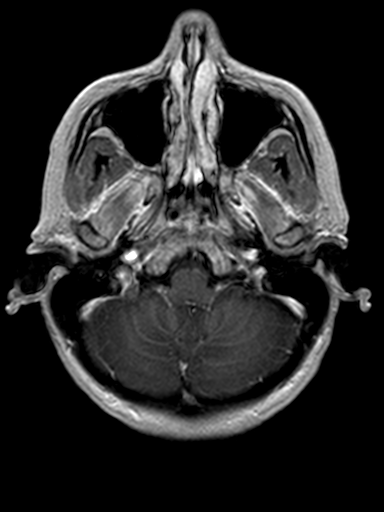
[im 32/79]
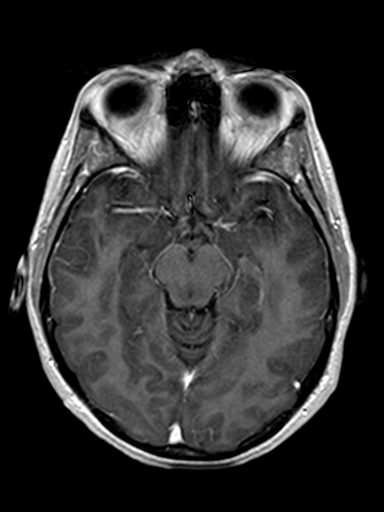
[im 47/79]
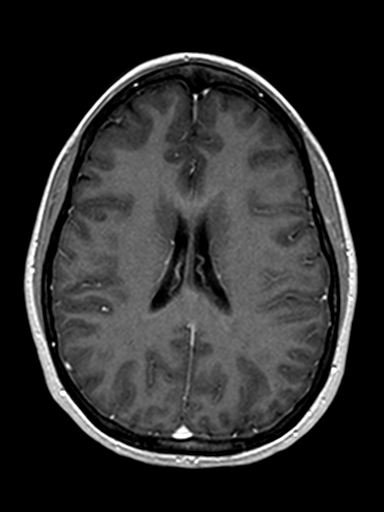
[im 63/79]
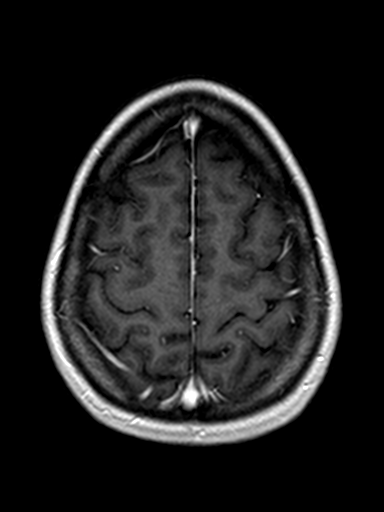
[im 79/79]
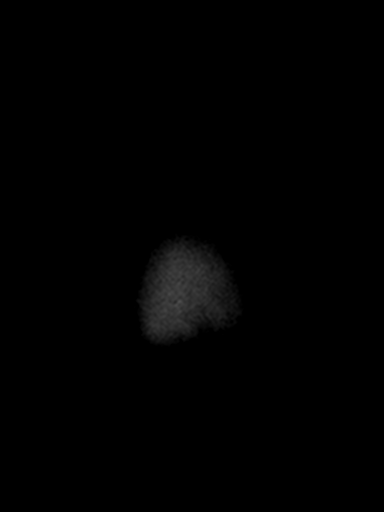

[Series 14: post cor · coronal · 5.0mm · 0.45mm/px · 1 of 26 slices shown]
[im 1/26]
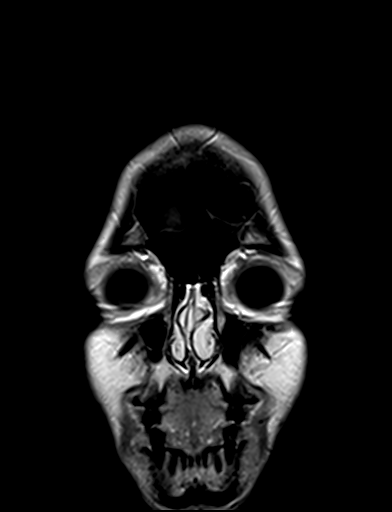

[45 of 48 positions shown; findings below may reference images not displayed]

FINDINGS: No change since previous study. No evidence of old or acute
infarction, intra-axial mass lesion, hemorrhage, hydrocephalus or
extra-axial collection.

Cerebellar tonsils extend to the foramen magnum 7-8 mm, consistent
with mild Chiari malformation. This can be associated with headache.

Benign appearing pineal cyst unchanged measuring approximately 10 x
20 by 14 mm. No enhancing component.

Arachnoid herniation into the sella. No pituitary mass. No
inflammatory sinus disease. No skull or skullbase abnormality.
IMPRESSION: No change since the previous study.

Mild Chiari malformation.

Pineal cyst, unchanged, with mild flattening of the tectum.

## 2018-01-16 ENCOUNTER — Telehealth: Payer: Self-pay | Admitting: Certified Nurse Midwife

## 2018-01-16 NOTE — Telephone Encounter (Signed)
Spoke with patient. Patient reports urinary frequency, only voiding small amounts, lower abdominal tenderness, lower back pain and possible low grade fever. Patient has not checked her temp. Denies dysuria, N/V.  Advised OV needed for further evaluation, patient declined. Patient states she is out of town and has several appts today including seeing her neurologist. Patient will plan to discuss symptoms with neuro. With symptoms present, recommended patient seek care at local Urgent Care or ER if unable to be treated by neurology. If symptoms do not resolve or f/u needed, return call to office for OV. Advised Dr. Quincy Simmonds will review, our office will return call if any additional recommendations. Patient verbalizes understanding.   Routing to provider for final review. Patient is agreeable to disposition. Will close encounter.

## 2018-01-16 NOTE — Telephone Encounter (Signed)
Patient is calling stating that she has a UTI. Patient declined multiple office visits for today as patient is currently in [place] at another appointment for the remainder of the day. Advised patient that we do not want her waiting through the weekend to be seen and would have a nurse return her call.

## 2018-01-29 ENCOUNTER — Encounter: Payer: Self-pay | Admitting: Certified Nurse Midwife

## 2018-01-29 ENCOUNTER — Other Ambulatory Visit: Payer: Self-pay

## 2018-01-29 ENCOUNTER — Ambulatory Visit (INDEPENDENT_AMBULATORY_CARE_PROVIDER_SITE_OTHER): Payer: Medicare HMO | Admitting: Certified Nurse Midwife

## 2018-01-29 VITALS — BP 100/70 | HR 64 | Temp 98.0°F | Resp 16 | Wt 109.0 lb

## 2018-01-29 DIAGNOSIS — N390 Urinary tract infection, site not specified: Secondary | ICD-10-CM | POA: Diagnosis not present

## 2018-01-29 LAB — POCT URINALYSIS DIPSTICK
BILIRUBIN UA: NEGATIVE
Blood, UA: NEGATIVE
GLUCOSE UA: NEGATIVE
KETONES UA: NEGATIVE
Leukocytes, UA: NEGATIVE
Nitrite, UA: NEGATIVE
Protein, UA: NEGATIVE
UROBILINOGEN UA: NEGATIVE U/dL — AB
pH, UA: 5 (ref 5.0–8.0)

## 2018-01-29 MED ORDER — CIPROFLOXACIN HCL 500 MG PO TABS: 500.0000 mg | ORAL_TABLET | Freq: Two times a day (BID) | ORAL | 0 refills | Status: DC

## 2018-01-29 NOTE — Patient Instructions (Signed)
Urinary Tract Infection, Adult A urinary tract infection (UTI) is an infection of any part of the urinary tract. The urinary tract includes:  The kidneys.  The ureters.  The bladder.  The urethra. These organs make, store, and get rid of pee (urine) in the body. What are the causes? This is caused by germs (bacteria) in your genital area. These germs grow and cause swelling (inflammation) of your urinary tract. What increases the risk? You are more likely to develop this condition if:  You have a small, thin tube (catheter) to drain pee.  You cannot control when you pee or poop (incontinence).  You are female, and: ? You use these methods to prevent pregnancy: ? A medicine that kills sperm (spermicide). ? A device that blocks sperm (diaphragm). ? You have low levels of a female hormone (estrogen). ? You are pregnant.  You have genes that add to your risk.  You are sexually active.  You take antibiotic medicines.  You have trouble peeing because of: ? A prostate that is bigger than normal, if you are female. ? A blockage in the part of your body that drains pee from the bladder (urethra). ? A kidney stone. ? A nerve condition that affects your bladder (neurogenic bladder). ? Not getting enough to drink. ? Not peeing often enough.  You have other conditions, such as: ? Diabetes. ? A weak disease-fighting system (immune system). ? Sickle cell disease. ? Gout. ? Injury of the spine. What are the signs or symptoms? Symptoms of this condition include:  Needing to pee right away (urgently).  Peeing often.  Peeing small amounts often.  Pain or burning when peeing.  Blood in the pee.  Pee that smells bad or not like normal.  Trouble peeing.  Pee that is cloudy.  Fluid coming from the vagina, if you are female.  Pain in the belly or lower back. Other symptoms include:  Throwing up (vomiting).  No urge to eat.  Feeling mixed up (confused).  Being tired  and grouchy (irritable).  A fever.  Watery poop (diarrhea). How is this treated? This condition may be treated with:  Antibiotic medicine.  Other medicines.  Drinking enough water. Follow these instructions at home:  Medicines  Take over-the-counter and prescription medicines only as told by your doctor.  If you were prescribed an antibiotic medicine, take it as told by your doctor. Do not stop taking it even if you start to feel better. General instructions  Make sure you: ? Pee until your bladder is empty. ? Do not hold pee for a long time. ? Empty your bladder after sex. ? Wipe from front to back after pooping if you are a female. Use each tissue one time when you wipe.  Drink enough fluid to keep your pee pale yellow.  Keep all follow-up visits as told by your doctor. This is important. Contact a doctor if:  You do not get better after 1-2 days.  Your symptoms go away and then come back. Get help right away if:  You have very bad back pain.  You have very bad pain in your lower belly.  You have a fever.  You are sick to your stomach (nauseous).  You are throwing up. Summary  A urinary tract infection (UTI) is an infection of any part of the urinary tract.  This condition is caused by germs in your genital area.  There are many risk factors for a UTI. These include having a small, thin   tube to drain pee and not being able to control when you pee or poop.  Treatment includes antibiotic medicines for germs.  Drink enough fluid to keep your pee pale yellow. This information is not intended to replace advice given to you by your health care provider. Make sure you discuss any questions you have with your health care provider. Document Released: 06/19/2007 Document Revised: 07/10/2017 Document Reviewed: 07/10/2017 Elsevier Interactive Patient Education  2019 Elsevier Inc.  

## 2018-01-29 NOTE — Progress Notes (Signed)
48 y.o. Legally Separated Caucasian female [id number] here with complaint of UTI, with onset  on last 24 hours.. Patient complaining of urinary frequency/urgency/ and pain with urination. Patient denies nausea or vomiting. Has felt cold and took Advil last pm and felt better. No new personal products. Patient feels may related to sexual activity. Denies any vaginal symptoms.    Contraception is POP. Patient is consuming  adequate water intake. Patient was treated with Macrobid and did not resolve.  Review of Systems  Constitutional: Negative.   HENT: Negative.   Eyes: Negative.   Respiratory: Negative.   Cardiovascular: Negative.   Gastrointestinal: Negative.   Genitourinary: Positive for frequency.       Pelvic pressure, tender back,  Musculoskeletal: Negative.   Skin: Negative.   Neurological: Negative.   Endo/Heme/Allergies: Negative.   Psychiatric/Behavioral: Negative.     O: Healthy female WDWN Affect: Normal, orientation x 3 Skin : warm and dry CVAT: negative bilateral Abdomen: positive for suprapubic tenderness  Pelvic exam: External genital area: normal, no lesions Bladder,Urethra tender, Urethral meatus: tender, red Vagina: normal vaginal discharge, normal appearance   Cervix: normal, non tender Uterus:normal,non tender Adnexa: normal non tender, no fullness or masses   A: UTI Normal pelvic exam poct urine-neg  P: Reviewed findings of UTI and need for treatment. Rx: Cipro see order with instructions DVV:[id number] culture Reviewed warning signs and symptoms of UTI and need to advise if occurring. Encouraged to limit soda, tea, and coffee and be sure to increase water intake.   RV prn

## 2018-01-30 LAB — URINE CULTURE

## 2018-02-10 ENCOUNTER — Telehealth: Payer: Self-pay | Admitting: Certified Nurse Midwife

## 2018-02-10 NOTE — Telephone Encounter (Signed)
Patient called requesting results from appointment on 01/29/18.

## 2018-02-11 NOTE — Telephone Encounter (Signed)
Spoke with patient.  Advised of culture results.  States she is still having some frequency and "pressure" feelings. Has increased water intake so unsure if related. No fevers or flank pain.  Not drinking sodas.  Completed Cipro.  Office visit offered as patient states she is unsure if symptoms resolved.  She declines at this time and states that she is in the process of moving.  Advised urgent care or ED if symptoms persist or worsen.  Patient states she will call back if desires an office visit for re-evaluation.

## 2018-02-12 ENCOUNTER — Encounter: Payer: Self-pay | Admitting: Certified Nurse Midwife

## 2018-02-12 ENCOUNTER — Other Ambulatory Visit: Payer: Self-pay

## 2018-02-12 ENCOUNTER — Ambulatory Visit (INDEPENDENT_AMBULATORY_CARE_PROVIDER_SITE_OTHER): Payer: Medicare HMO | Admitting: Certified Nurse Midwife

## 2018-02-12 VITALS — BP 110/74 | HR 64 | Temp 98.4°F | Resp 16 | Wt 115.0 lb

## 2018-02-12 DIAGNOSIS — N390 Urinary tract infection, site not specified: Secondary | ICD-10-CM

## 2018-02-12 DIAGNOSIS — Z113 Encounter for screening for infections with a predominantly sexual mode of transmission: Secondary | ICD-10-CM | POA: Diagnosis not present

## 2018-02-12 DIAGNOSIS — Z87898 Personal history of other specified conditions: Secondary | ICD-10-CM

## 2018-02-12 LAB — POCT URINALYSIS DIPSTICK
Bilirubin, UA: NEGATIVE
Glucose, UA: NEGATIVE
Ketones, UA: NEGATIVE
Leukocytes, UA: NEGATIVE
Nitrite, UA: NEGATIVE
Protein, UA: NEGATIVE
RBC UA: NEGATIVE
Urobilinogen, UA: NEGATIVE E.U./dL — AB
pH, UA: 7 (ref 5.0–8.0)

## 2018-02-12 NOTE — Progress Notes (Signed)
48 y.o. Legally Separated Caucasian female [id number] here with complaint of UTI, with general discomfort.. Patient complaining of urinary urgency with urination. Also does not feel she has relief with emptying at times. Patient denies fever, chills, nausea or back pain. No new personal products. Patient feels she may have had more discomfort with past sexual activity. Denies any vaginal symptoms, that she is aware of.  Contraception is OCP.dryness. Patient is consuming adequate water intake. Recent urine culture after positive for urogenital bacteria. Patient was treated with Cipro, with good relief and now symptomatic again. Patient has had partner change, and no STD screening. Agreeable to screening. No other health issues today.  Review of Systems  Constitutional: Negative.   HENT: Negative.   Eyes: Negative.   Respiratory: Negative.   Cardiovascular: Negative.   Gastrointestinal: Negative.   Genitourinary: Positive for urgency. Negative for dysuria, flank pain, frequency and hematuria.  Musculoskeletal: Negative.   Skin: Negative.   Neurological: Negative.   Endo/Heme/Allergies: Negative.   Psychiatric/Behavioral: Negative.     O: Healthy female WDWN Affect: Normal, orientation x 3 Skin : warm and dry CVAT: negative  bilateral Abdomen: soft, non tender, negative for suprapubic tenderness  Pelvic exam: External genital area: normal, no lesions Bladder,Urethra not tender, Urethral meatus:  Not tender,no redness Vagina:watery non odorous vaginal discharge, normal appearance   Cervix: normal,slight tenderness Uterus:normal,non tender Adnexa: normal non tender, no fullness or masses   A:Normal pelvic exam poct urine- ph 7.0 R/O STD  Urinary urgency, recent treatment for UTI ? Post coital symtoms  P: Reviewed findings of normal pelvic exam and abdominal exam, no evidence of UTI by exam. Discussed STD or vaginal infection can cause UTI like symptoms. Will await results and treat if  indicated. Lab: GC/Chlamydia, [facility]. YCX:KGYJE culture Reviewed warning signs and symptoms of UTI and need to advise if occurring. Empty bladder before and after sexual activity. Increase fluids to prevent symptoms. Encouraged to limit soda, tea, and coffee and be sure to increase water intake.   RV prn

## 2018-02-13 ENCOUNTER — Telehealth: Payer: Self-pay

## 2018-02-13 LAB — VAGINITIS/VAGINOSIS, DNA PROBE
Candida Species: NEGATIVE
Gardnerella vaginalis: POSITIVE — AB
Trichomonas vaginosis: NEGATIVE

## 2018-02-13 LAB — URINE CULTURE: Organism ID, Bacteria: NO GROWTH

## 2018-02-13 NOTE — Telephone Encounter (Signed)
Mailbox full. Try again. 

## 2018-02-13 NOTE — Telephone Encounter (Signed)
Notify patient her vaginal screen was positive for BV, this will cause some of the urgency,and burning she has noted She needs RxTindamax 500 mg bid x 5 with food. No alcohol use due nausea  Yeast and trichomas are negative.  All other labs pending    Mailbox full. Try again.

## 2018-02-15 LAB — GC/CHLAMYDIA PROBE AMP
Chlamydia trachomatis, NAA: NEGATIVE
Neisseria gonorrhoeae by PCR: NEGATIVE

## 2018-02-17 MED ORDER — CLINDAMYCIN PHOSPHATE 2 % VA CREA: 1.0000 | TOPICAL_CREAM | Freq: Every day | VAGINAL | 0 refills | Status: AC

## 2018-02-17 NOTE — Telephone Encounter (Signed)
Patient notified of results & cleocin 2% cream sent to pharmacy to use vaginally every night x 7 due to possible reaction to tindamax.

## 2018-03-17 ENCOUNTER — Telehealth: Payer: Self-pay | Admitting: Certified Nurse Midwife

## 2018-03-17 NOTE — Telephone Encounter (Signed)
ERRONEOUS ENCOUNTER

## 2018-03-20 ENCOUNTER — Telehealth: Payer: Self-pay | Admitting: Certified Nurse Midwife

## 2018-03-20 NOTE — Telephone Encounter (Signed)
Encounter reviewed and closed.  I will be happy to see the patient on Monday.

## 2018-03-20 NOTE — Telephone Encounter (Signed)
Spoke with patient. Reports ongoing UTI symptoms. Using AZO with relief. States UTI sx have been ongoing since holidays. Has been seen by Melvia Heaps CNM x 2 with negative testing.  Declines office visit today for evaluation.  Office visit with Dr. Quincy Simmonds on Monday scheduled.  Discussed s/s of urinary emergencies. Go to closest ED if develops fevers, increased pain, blood in urine, unable to pass urine or N/V/D. I advised patient to go to urgent care today since she could not come to office but she declines this as she states when she goes to urgent care they "do nothing."  She is also experiencing allergy related symptoms. Nasal congestion and cough. Advised if not improved by Monday morning will need to reschedule appointment related to URI symptoms. Pt verbalized understanding and will call back with any concerns.

## 2018-03-20 NOTE — Telephone Encounter (Signed)
Patient is calling regarding UTI/kidney symptoms. Patient stated this has been ongoing since Christmas. Patient stated that she has sensitivity in her stomach, urgency, and lower back pain. Patient says she has not taken her temperature, but says she feels like she has "a low grade fever."

## 2018-03-23 ENCOUNTER — Encounter: Payer: Self-pay | Admitting: Obstetrics and Gynecology

## 2018-03-23 ENCOUNTER — Ambulatory Visit: Payer: Medicare HMO | Admitting: Obstetrics and Gynecology

## 2018-03-23 ENCOUNTER — Other Ambulatory Visit: Payer: Self-pay

## 2018-03-23 VITALS — BP 106/72 | HR 70 | Ht 59.25 in | Wt 113.2 lb

## 2018-03-23 DIAGNOSIS — R350 Frequency of micturition: Secondary | ICD-10-CM

## 2018-03-23 DIAGNOSIS — R102 Pelvic and perineal pain: Secondary | ICD-10-CM

## 2018-03-23 LAB — POCT URINALYSIS DIPSTICK
Bilirubin, UA: NEGATIVE
Blood, UA: NEGATIVE
Glucose, UA: NEGATIVE
Ketones, UA: NEGATIVE
NITRITE UA: NEGATIVE
Protein, UA: NEGATIVE
UROBILINOGEN UA: 0.2 U/dL
pH, UA: 5 (ref 5.0–8.0)

## 2018-03-23 NOTE — Progress Notes (Signed)
GYNECOLOGY  VISIT   HPI: 48 y.o.   Legally Separated  Caucasian  female   G0P0000 with No LMP recorded. (Menstrual status: Oral contraceptives).   here for recurrent UTI symptoms--urinary frequency, urinary urgency, back pain and lower pelvic tenderness. She has underlying worry that something is not right.   Engaged around Christmas.  Increased sexual activity then.  Tx for UTI by urgent care.  States UC was inconclusive.  Took an abx and her symptoms did not resolve.  Has low back pain.  Has renal stones and she does use a diuretic.   States her abdomen is uncomfortable.  Does not like the weight of her cats on top of her.  Feels pressure on the bladder and a bloating feeling.  She feels the need to void often.  Taking AZO which is helping.   Is taking cranberry capsules.  Using orange in her urine.  One cup of herbal tea per day.  No carbonated beverages.   Denies hx of UTIs.   No current vaginal discharge.  Tx for BV 02/12/18 with Cleocin.   Neg GC/CT 02/12/18. UC neg 1/30. UC 1/16 showing mixed flora 50,000 - 100,000.  Thinks she has a low grade fever.   No menses really at all with her POPs.  No ovulation pain.   Painful intercourse with full penetration.   Takes pain medication regularly.  Taking morphine daily and the Oxy IR as needed.  Taking Miralax.  BM once a day.  Urine Dip: trace WBCs    GYNECOLOGIC HISTORY: No LMP recorded. (Menstrual status: Oral contraceptives). Contraception:  POP Menopausal hormone therapy: none Last mammogram: 09-24-17 3D Neg/density C/BiRads1 Last pap smear: 08-27-17 Neg        OB History    Gravida  0   Para  0   Term  0   Preterm  0   AB  0   Living  0     SAB  0   TAB  0   Ectopic  0   Multiple  0   Live Births  0              Patient Active Problem List   Diagnosis Date Noted  . 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 HEADACHE 07/28/2008  . HYPERTHYROIDISM 07/25/2008  . HEMORRHOIDS  07/25/2008  . PAIN IN JOINT OTHER SPECIFIED SITES 07/25/2008  . NAUSEA 07/25/2008  . DIARRHEA 07/25/2008  . ABDOMINAL PAIN, UNSPECIFIED SITE 07/25/2008  . Personal history of other diseases of digestive system 07/25/2008    Past Medical History:  Diagnosis Date  . 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 Fibromyalgia syndrome 2008  . Pineal gland cyst 2001   will be having surgery in 2015?    Past Surgical History:  Procedure Laterality Date  . CHOLECYSTECTOMY, LAPAROSCOPIC  10/05  . INTRAUTERINE DEVICE INSERTION  09/13/96, removed 4/09   paraguard  . KNEE ARTHROSCOPY Right 11/11   with lateral release    Current Outpatient Medications  Medication Sig Dispense Refill  . aMILoride (MIDAMOR) 5 MG tablet Take 5 mg by mouth daily.    . clonazePAM (KLONOPIN) 1 MG tablet Take 1 mg by mouth at bedtime as needed.  5  . hydrochlorothiazide (HYDRODIURIL) 50 MG tablet Take 50 mg by mouth daily.    . hydrocortisone (ANUSOL-HC) 25 MG suppository Place 25 mg rectally 2 (two) times daily as needed for hemorrhoids or itching.     Marland Kitchen ibuprofen (ADVIL,MOTRIN) 200 MG  tablet Take by mouth daily as needed.    Marland Kitchen levothyroxine (SYNTHROID, LEVOTHROID) 88 MCG tablet Take 88 mcg by mouth daily before breakfast.    . norethindrone (MICRONOR,CAMILA,ERRIN) 0.35 MG tablet TAKE 1 TABLET BY MOUTH EVERY DAY 84 tablet 3  . oxyCODONE (OXY IR/ROXICODONE) 5 MG immediate release tablet 5 mg as needed.    . Polyethylene Glycol 3350 (MIRALAX PO) Take by mouth.    . temazepam (RESTORIL) 15 MG capsule Take 15-30 mg by mouth at bedtime as needed for sleep.     No current facility-administered medications for this visit.      ALLERGIES: Flagyl [metronidazole] and Lyrica [pregabalin]  Family History  Problem Relation Age of Onset  . Cancer Maternal Grandmother 35       colon  . Cancer Mother 67       thyroid  . Thyroid disease Mother   . Thyroid disease Father   . Colon cancer Maternal Uncle 82     Social History   Socioeconomic History  . Marital status: Legally Separated    Spouse name: Not on file  . Number of children: Not on file  . Years of education: Not on file  . Highest education level: Not on file  Occupational History  . Not on file  Social Needs  . Financial resource strain: Not on file  . Food insecurity:    Worry: Not on file    Inability: Not on file  . Transportation needs:    Medical: Not on file    Non-medical: Not on file  Tobacco Use  . Smoking status: Former Smoker    Packs/day: 0.50    Years: 22.00    Pack years: 11.00    Last attempt to quit: 12/23/2014    Years since quitting: 3.2  . Smokeless tobacco: Never Used  Substance and Sexual Activity  . Alcohol use: No    Alcohol/week: 0.0 standard drinks  . Drug use: Never  . Sexual activity: Not Currently    Partners: Male    Birth control/protection: Pill  Lifestyle  . Physical activity:    Days per week: Not on file    Minutes per session: Not on file  . Stress: Not on file  Relationships  . Social connections:    Talks on phone: Not on file    Gets together: Not on file    Attends religious service: Not on file    Active member of club or organization: Not on file    Attends meetings of clubs or organizations: Not on file    Relationship status: Not on file  . Intimate partner violence:    Fear of current or ex partner: Not on file    Emotionally abused: Not on file    Physically abused: Not on file    Forced sexual activity: Not on file  Other Topics Concern  . Not on file  Social History Narrative  . Not on file    Review of Systems  Genitourinary: Positive for frequency and urgency.  All other systems reviewed and are negative.   PHYSICAL EXAMINATION:    BP 106/72 (BP Location: Right Arm, Patient Position: Sitting, Cuff Size: Normal)   Pulse 70   Ht 4' 11.25" (1.505 m)   Wt 113 lb 3.2 oz (51.3 kg)   BMI 22.67 kg/m     General appearance: alert, cooperative and  appears stated age   Abdomen: soft, non-tender, no masses,  no organomegaly   No abnormal inguinal  nodes palpated   Pelvic: External genitalia:  no lesions              Urethra:  normal appearing urethra with no masses, tenderness or lesions              Bartholins and Skenes: normal                 Vagina: normal appearing vagina with normal color and discharge, no lesions              Cervix: no lesions.  No CMT.                Bimanual Exam:  Uterus:  normal size, contour, position, consistency, mobility, non-tender              Adnexa: no mass, fullness, tenderness             Chaperone was present for exam.  ASSESSMENT  Pelvic tenderness.  Dyspareunia.  Recent BV.  Recent mixed bacteria on UC.  Urinary frequency.  Abnormal urine dip. Chronic narcotic use.  PLAN  We discussed reasons for pelvic tenderness - UTI, BV, interstitial cystitis, endometriosis, ovarian cysts, bowel. Will send urine micro and culture.  Avoid bladder irritants.  Vaginitis testing.  Return for pelvic ultrasound.  To urology is above is negative.    An After Visit Summary was printed and given to the patient.  ___25___ minutes face to face time of which over 50% was spent in counseling.

## 2018-03-25 LAB — NUSWAB VAGINITIS PLUS (VG+)
Candida albicans, NAA: NEGATIVE
Candida glabrata, NAA: NEGATIVE
Chlamydia trachomatis, NAA: NEGATIVE
Neisseria gonorrhoeae, NAA: NEGATIVE
Trich vag by NAA: NEGATIVE

## 2018-04-01 ENCOUNTER — Telehealth: Payer: Self-pay | Admitting: Obstetrics and Gynecology

## 2018-04-01 NOTE — Telephone Encounter (Signed)
Encounter reviewed and closed.  

## 2018-04-01 NOTE — Telephone Encounter (Signed)
Patient canceled her upcoming PUS appointment 04/02/18 due to coronavirus fears. She will call later to reschedule "when everything has calmed down".

## 2018-04-02 ENCOUNTER — Other Ambulatory Visit: Payer: Medicare HMO

## 2018-04-02 ENCOUNTER — Other Ambulatory Visit: Payer: Medicare HMO | Admitting: Obstetrics and Gynecology

## 2018-06-11 ENCOUNTER — Other Ambulatory Visit: Payer: Self-pay | Admitting: Certified Nurse Midwife

## 2018-06-11 ENCOUNTER — Other Ambulatory Visit: Payer: Self-pay | Admitting: Obstetrics and Gynecology

## 2018-06-11 NOTE — Telephone Encounter (Signed)
Spoke with patient. Advised current Rx on file for Micronor 0.35mg  tab at [facility], sent on 09/29/17, #84/3RF. Advised patient to contact pharmacy to request Rx transfer. Pharmacy has been updated for future Rx. Advised patient to return call to office if any additional assistance needed. Patient thankful for return call, verbalizes understanding.   Encounter closed.

## 2018-06-11 NOTE — Telephone Encounter (Signed)
Patient says her refill for birth control has been denied. [facility] [place] [phone].

## 2018-06-11 NOTE — Telephone Encounter (Signed)
Patient returned call. Patient states she contacted [facility] and was advised no refills on file. Patient states [facility]. Advised [facility] and return call. [facility]  Call placed to [facility] [place], spoke with [place]. Was advised Original RX 09/29/17 #84 tabs was received with 2 refills. Patient had Rx filled 09/2017, 12/2017 and 03/2017, no refills remaining on file. Advised original order sent on 09/29/17 for Micronor #84/3RF verbal order given for Micronor 0.35 mg tab PO daily #84tabs/0RF given.   Call returned to patient, advised as seen above.   Routing to provider for final review. Patient is agreeable to disposition. Will close encounter.

## 2018-07-21 ENCOUNTER — Telehealth: Payer: Self-pay | Admitting: Certified Nurse Midwife

## 2018-07-21 NOTE — Telephone Encounter (Signed)
Call to patient. Patient states she had intercourse 4 days ago and like clock work UTI symptoms started 2 days ago. States she is only able to void in small amounts and it is really painful. Also complaining of frequency. Does not have access to a thermometer, so unable to check her temperature. States she hasn't felt great today. Just took AZO for the first time. Patient also states she only drinks water and stays well hydrated. RN advised OV needed for further evaluation. Patient very hesitant stating she is very concerned about getting out and risking exposure. RN advised our office taking necessary precautions. Patient still voiced concerns. States she lives an hour away and is nervous about having to be at our office for a long time. MyChart video offered to patient, but states she does not have access to MyChart. RN advised would review with Dr. Quincy Simmonds for recommendations. Patient scheduled for 07-22-2018 at 1500, but patient states will only come if Dr. Quincy Simmonds feels it is absolutely necessary.   Routing to provider for review.

## 2018-07-21 NOTE — Telephone Encounter (Signed)
Patient believes she has a UTI. Refused an appointment. States she has a history of UTIs and would like a nurse to check her chart before scheduling anything.

## 2018-07-22 ENCOUNTER — Ambulatory Visit: Payer: Medicare HMO | Admitting: Obstetrics and Gynecology

## 2018-07-22 NOTE — Telephone Encounter (Signed)
Dr.Silva advised patient keep appointment with [place] or go to a local urgent care center for evaluation. We understand her concerns regarding COVID but it is not our protocol to treat possible UTI by phone. Patient states she appreciates our phone conversations with her and listening to her concerns. She is going to continue with AZO for now, work on her courage to come into the office and asked me to cancel today's appointment. Once again, she thanked me for our time.  Routed to provider to sign and close encounter.

## 2018-07-22 NOTE — Telephone Encounter (Signed)
Encounter reviewed and closed.  

## 2018-07-22 NOTE — Telephone Encounter (Signed)
I agree with this office visit today.

## 2018-07-22 NOTE — Telephone Encounter (Signed)
Called patient to advised Dr.Silva does want her to come into office for evaluation today. Patient still very hesitant to come in. She is very concerned stating she literally hasn't been out of her house except to drive up and pick up her groceries since Covid started.  She wants to know if she can just come in drop on urine sample and leave. Patient does not want to be staying any length of time in office. Tried to reassure we are taking necessary precautions. She still wants me to speak with Dr.Silva. Will call her back with recommendations.

## 2018-07-23 ENCOUNTER — Telehealth: Payer: Self-pay | Admitting: Certified Nurse Midwife

## 2018-07-23 NOTE — Telephone Encounter (Signed)
Spoke with patient and complains of urinary frequency and only emptying small amounts. Advised needs office visit to evaluate. Made appointment for tomorrow 4:30pm with Melvia Heaps, CNM.

## 2018-07-23 NOTE — Telephone Encounter (Signed)
Patient is calling regarding possible UTI. Patient stated that this is a reoccurring issue every time she has intercourse.

## 2018-07-24 ENCOUNTER — Encounter: Payer: Self-pay | Admitting: Certified Nurse Midwife

## 2018-07-24 ENCOUNTER — Ambulatory Visit (INDEPENDENT_AMBULATORY_CARE_PROVIDER_SITE_OTHER): Payer: Medicare Other | Admitting: Certified Nurse Midwife

## 2018-07-24 ENCOUNTER — Other Ambulatory Visit: Payer: Self-pay

## 2018-07-24 ENCOUNTER — Other Ambulatory Visit: Payer: Self-pay | Admitting: Certified Nurse Midwife

## 2018-07-24 VITALS — BP 110/72 | HR 68 | Temp 98.1°F | Resp 16 | Wt 134.0 lb

## 2018-07-24 DIAGNOSIS — N390 Urinary tract infection, site not specified: Secondary | ICD-10-CM

## 2018-07-24 MED ORDER — NITROFURANTOIN MONOHYD MACRO 100 MG PO CAPS: ORAL_CAPSULE | ORAL | 0 refills | Status: DC

## 2018-07-24 NOTE — Progress Notes (Signed)
48 y.o. Divorced Caucasian female G0P0000 here with complaint of UTI, with onset  In the past 2-3 days.. Patient complaining of urinary frequency/urgency/ and pressure with urination. Patient denies fever, chills, nausea or ? back pain. No new personal products. Patient feels related to sexual activity. Had  Not been sexually active in the past few months and no UTI. Had sexual activity and symptoms started again. " Happens frequently with sexual activity... Denies any vaginal symptoms.   Contraception is OCP. No partner change, no STD concerns.  Patient drinking adequate water intake.  Review of Systems  Constitutional: Negative.   HENT: Negative.   Eyes: Negative.   Respiratory: Negative.   Cardiovascular: Negative.   Gastrointestinal: Negative.   Genitourinary: Positive for dysuria, frequency and urgency.       Back pain, pressure  Musculoskeletal: Negative.   Skin: Negative.   Neurological: Negative.   Endo/Heme/Allergies: Negative.   Psychiatric/Behavioral: Negative.     O: Healthy female WDWN Affect: Normal, orientation x 3 Skin : warm and dry CVAT: negative bilateral Abdomen: positive for suprapubic tenderness  Pelvic exam: External genital area: normal, no lesions Bladder,Urethra tender, Urethral meatus: tender, red Vagina: normal vaginal discharge, normal appearance   Cervix: normal, non tender Uterus:normal,non tender Adnexa: normal non tender, no fullness or masses  poct urine- unable to dip due to AZO A: UTI suspect post coital Normal pelvic exam  P: Reviewed findings of UTI and need for treatment. [id number] see order with instructions [id number]  culture Reviewed warning signs and symptoms of UTI and need to advise if occurring. Discussed emptying bladder before and after sexual activity. Will discuss post coital treatment once culture in. Encouraged to limit soda, tea, and coffee and be sure to increase water intake.   RV prn

## 2018-07-26 LAB — URINE CULTURE

## 2018-07-27 ENCOUNTER — Telehealth: Payer: Self-pay | Admitting: Obstetrics and Gynecology

## 2018-07-27 ENCOUNTER — Telehealth: Payer: Self-pay | Admitting: Certified Nurse Midwife

## 2018-07-27 NOTE — Telephone Encounter (Signed)
Please note that the patient has reported several symptoms related to Covid 19 infection.  I recommend she pursue testing for this.   Cc- Isabel Thomas

## 2018-07-27 NOTE — Telephone Encounter (Signed)
Patient calling to go over benefits for ultrasound scheduled on 08/04/18.

## 2018-07-27 NOTE — Telephone Encounter (Signed)
Left message to call Selicia Windom, RN at [facility] [phone].   

## 2018-07-27 NOTE — Telephone Encounter (Signed)
Spoke with patient. Advised as seen below per Melvia Heaps, CNM. Patient request afternoon appt, has 1 hr drive.  PUS scheduled for 7/21 at 4pm, consult at 4:30pm with Dr. Quincy Simmonds. Order previously placed for precert. Patient agreeable to date and time.   Routing to provider for final review. Patient is agreeable to disposition. Will close encounter.  Cc: Dr. Quincy Simmonds, Lerry Liner

## 2018-07-27 NOTE — Telephone Encounter (Signed)
Would not give antibiotics at this point due to recent concern with medication and need for PUS.

## 2018-07-27 NOTE — Telephone Encounter (Signed)
Call reviewed with Melvia Heaps, CNM. Agree to plan. Macrobid not sulfa, no alternative Rx recommended at this time if urinary symptoms resolved.   Call returned to patient, advised per Melvia Heaps, CNM. Patient states urinary symptoms have resolved, states she feels like symptoms were related to Macrobid. Patient states she has been voiding before and after intercourse, requesting prophylactic abx for future use, dose not want to take Macrobid again. Advised I will update Melvia Heaps, CNM and return call with recommendations, patient agreeable.   Melvia Heaps, CNM -please advise.

## 2018-07-27 NOTE — Telephone Encounter (Signed)
Notes recorded by Regina Eck, CNM on 07/26/2018 at 1:49 PM EDT  Notify patient her urine culture showed low colony count at 25 to 50,000 mixed urogenital flora, which was probably sexual activity related. She needs to empty bladder before and after sexual activity to decrease risk of occurring again. Complete medication. She needs to come in have PUS with Dr. Quincy Simmonds as discussed. Check to see if still scheduled.

## 2018-07-27 NOTE — Telephone Encounter (Signed)
Patient stated that she had an allergic reaction to the medication she was prescribed for her UIT.

## 2018-07-27 NOTE — Telephone Encounter (Signed)
Spoke with patient. Reports starting Macrobid on 7/10 for UTI symptoms. Reports "not feeling well", fever, chest tightness, difficulty breathing, chills, sweats, chest congestion, coughing, "feeling lethargic", tired, achy and "low oxygen on home pulse oximetry".  Took last dose of Macrobid morning of 7/12, "symptoms have resolved, feeling better today". Patient states she thinks she may have been advised in her teenage years that she has a sensitivity to sulfa drugs, but is unsure.   Advised patient of results as seen below per Melvia Heaps, CNM. Advised to stop macrobid. Advised if symptoms persist or new symptoms develop needs to be evaluated by PCP/Urgent Care for further evaluation. No PUS scheduled to date. Will review with Melvia Heaps, CNM and return call.   Melvia Heaps, CNM -please review and advise.

## 2018-07-27 NOTE — Telephone Encounter (Signed)
Returned call to patient to convey benefits for scheduled ultrasound on 08/04/2018. Left voicemail message requesting a return call

## 2018-07-28 NOTE — Telephone Encounter (Signed)
Unable to reach, mailbox full, unable to leave message.

## 2018-08-04 ENCOUNTER — Other Ambulatory Visit: Payer: Self-pay

## 2018-08-04 ENCOUNTER — Ambulatory Visit (INDEPENDENT_AMBULATORY_CARE_PROVIDER_SITE_OTHER): Payer: Medicare Other

## 2018-08-04 ENCOUNTER — Encounter: Payer: Self-pay | Admitting: Obstetrics and Gynecology

## 2018-08-04 ENCOUNTER — Ambulatory Visit: Payer: Medicare Other | Admitting: Obstetrics and Gynecology

## 2018-08-04 VITALS — BP 100/68 | HR 88 | Temp 98.1°F | Resp 12 | Ht 59.25 in | Wt 133.8 lb

## 2018-08-04 DIAGNOSIS — N941 Unspecified dyspareunia: Secondary | ICD-10-CM

## 2018-08-04 DIAGNOSIS — R102 Pelvic and perineal pain: Secondary | ICD-10-CM

## 2018-08-04 DIAGNOSIS — N952 Postmenopausal atrophic vaginitis: Secondary | ICD-10-CM | POA: Diagnosis not present

## 2018-08-04 DIAGNOSIS — D219 Benign neoplasm of connective and other soft tissue, unspecified: Secondary | ICD-10-CM | POA: Diagnosis not present

## 2018-08-04 MED ORDER — ESTRADIOL 0.1 MG/GM VA CREA: TOPICAL_CREAM | VAGINAL | 2 refills | Status: DC

## 2018-08-04 NOTE — Patient Instructions (Signed)
Estradiol vaginal cream What is this medicine? ESTRADIOL (es tra DYE ole) contains the female hormone estrogen. It is used for symptoms of menopause, like vaginal dryness and irritation. This medicine may be used for other purposes; ask your health care provider or pharmacist if you have questions. COMMON BRAND NAME(S): Estrace What should I tell my health care provider before I take this medicine? They need to know if you have any of these conditions:  abnormal vaginal bleeding  blood vessel disease or blood clots  breast, cervical, endometrial, ovarian, liver, or uterine cancer  dementia  diabetes  gallbladder disease  heart disease or recent heart attack  high blood pressure  high cholesterol  high levels of calcium in the blood  hysterectomy  kidney disease  liver disease  migraine headaches  protein C deficiency  protein S deficiency  stroke  systemic lupus erythematosus (SLE)  tobacco smoker  an unusual or allergic reaction to estrogens, other hormones, soy, other medicines, foods, dyes, or preservatives  pregnant or trying to get pregnant  breast-feeding How should I use this medicine? This medicine is for use in the vagina only. Do not take by mouth. Follow the directions on the prescription label. Read package directions carefully before using. Use the special applicator supplied with the cream. Wash hands before and after use. Fill the applicator with the prescribed amount of cream. Lie on your back, part and bend your knees. Insert the applicator into the vagina and push the plunger to expel the cream into the vagina. Wash the applicator with warm soapy water and rinse well. Use exactly as directed for the complete length of time prescribed. Do not stop using except on the advice of your doctor or health care professional. A patient package insert for the product will be given with each prescription and refill. Read this sheet carefully each time. The  sheet may change frequently. Talk to your pediatrician regarding the use of this medicine in children. This medicine is not approved for use in children. Overdosage: If you think you have taken too much of this medicine contact a poison control center or emergency room at once. NOTE: This medicine is only for you. Do not share this medicine with others. What if I miss a dose? If you miss a dose, use it as soon as you can. If it is almost time for your next dose, use only that dose. Do not use double or extra doses. What may interact with this medicine? Do not take this medicine with any of the following medications:  aromatase inhibitors like aminoglutethimide, anastrozole, exemestane, letrozole, testolactone This medicine may also interact with the following medications:  barbiturates used for inducing sleep or treating seizures  carbamazepine  grapefruit juice  medicines for fungal infections like ketoconazole and itraconazole  raloxifene  rifabutin  rifampin  rifapentine  ritonavir  some antibiotics used to treat infections  St. John's Wort  tamoxifen  warfarin This list may not describe all possible interactions. Give your health care provider a list of all the medicines, herbs, non-prescription drugs, or dietary supplements you use. Also tell them if you smoke, drink alcohol, or use illegal drugs. Some items may interact with your medicine. What should I watch for while using this medicine? Visit your health care professional for regular checks on your progress. You will need a regular breast and pelvic exam. You should also discuss the need for regular mammograms with your health care professional, and follow his or her guidelines. This medicine   can make your body retain fluid, making your fingers, hands, or ankles swell. Your blood pressure can go up. Contact your doctor or health care professional if you feel you are retaining fluid. If you have any reason to think  you are pregnant, stop taking this medicine at once and contact your doctor or health care professional. Tobacco smoking increases the risk of getting a blood clot or having a stroke, especially if you are more than 48 years old. You are strongly advised not to smoke. If you wear contact lenses and notice visual changes, or if the lenses begin to feel uncomfortable, consult your eye care specialist. If you are going to have elective surgery, you may need to stop taking this medicine beforehand. Consult your health care professional for advice prior to scheduling the surgery. What side effects may I notice from receiving this medicine? Side effects that you should report to your doctor or health care professional as soon as possible:  allergic reactions like skin rash, itching or hives, swelling of the face, lips, or tongue  breast tissue changes or discharge  changes in vision  chest pain  confusion, trouble speaking or understanding  dark urine  general ill feeling or flu-like symptoms  light-colored stools  nausea, vomiting  pain, swelling, warmth in the leg  right upper belly pain  severe headaches  shortness of breath  sudden numbness or weakness of the face, arm or leg  trouble walking, dizziness, loss of balance or coordination  unusual vaginal bleeding  yellowing of the eyes or skin Side effects that usually do not require medical attention (report to your doctor or health care professional if they continue or are bothersome):  hair loss  increased hunger or thirst  increased urination  symptoms of vaginal infection like itching, irritation or unusual discharge  unusually weak or tired This list may not describe all possible side effects. Call your doctor for medical advice about side effects. You may report side effects to FDA at 1-800-FDA-1088. Where should I keep my medicine? Keep out of the reach of children. Store at room temperature between 15 and 30  degrees C (59 and 86 degrees F). Protect from temperatures above 40 degrees C (104 degrees C). Do not freeze. Throw away any unused medicine after the expiration date. NOTE: This sheet is a summary. It may not cover all possible information. If you have questions about this medicine, talk to your doctor, pharmacist, or health care provider.  2020 Elsevier/Gold Standard (2010-04-04 09:18:12)  

## 2018-08-04 NOTE — Progress Notes (Signed)
GYNECOLOGY  VISIT   HPI: 48 y.o.   Divorced  Caucasian  female   [place] with Patient's last menstrual period was 07/07/2018 (exact date).   here for ultrasound for pelvic pain, painful intercourse.   Had intercourse and developed symptoms again.  She has a painful pressure on her bladder.  Not able to have her cat on her abdomen.  Pain is now better.  Has pain with urination just after intercourse.   She took Macrobid for potential UTI and developed lethargy, fever, chest congestion, sweats, low O2 on pulse oximetry.  She stopped the medication, and her symptoms resolved.  She did not pursue Covid testing as we recommended.   She states the Macrobid may have helped to reduce her bladder symptoms.   Her UC from 07/24/18 and 02/12/18 were negative.   She does not have spontaneous pelvic pain.   GYNECOLOGIC HISTORY: Patient's last menstrual period was 07/07/2018 (exact date). Contraception: Micronor Menopausal hormone therapy:  none Last mammogram:  09/25/18 BIRADS 1 negative/density c Last pap smear:   08/27/17 Negative        OB History    Gravida  0   Para  0   Term  0   Preterm  0   AB  0   Living  0     SAB  0   TAB  0   Ectopic  0   Multiple  0   Live Births  0              Patient Active Problem List   Diagnosis Date Noted  . 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 HEADACHE 07/28/2008  . HYPERTHYROIDISM 07/25/2008  . HEMORRHOIDS 07/25/2008  . PAIN IN JOINT OTHER SPECIFIED SITES 07/25/2008  . NAUSEA 07/25/2008  . DIARRHEA 07/25/2008  . ABDOMINAL PAIN, UNSPECIFIED SITE 07/25/2008  . Personal history of other diseases of digestive system 07/25/2008    Past Medical History:  Diagnosis Date  . Arnold-Chiari syndrome ([place]) 2000   cause of Headaches  . Fibromyalgia syndrome 2008  . Pineal gland cyst 2001   will be having surgery in 2015?    Past Surgical History:  Procedure Laterality Date  . CHOLECYSTECTOMY, LAPAROSCOPIC  10/05  .  INTRAUTERINE DEVICE INSERTION  09/13/96, removed 4/09   paraguard  . KNEE ARTHROSCOPY Right 11/11   with lateral release    Current Outpatient Medications  Medication Sig Dispense Refill  . Acetylcysteine (N-ACETYL-L-CYSTEINE PO) Take by mouth 2 (two) times daily.    Marland Kitchen aMILoride (MIDAMOR) 5 MG tablet Take 5 mg by mouth daily.    . clonazePAM (KLONOPIN) 1 MG tablet Take 1 mg by mouth at bedtime as needed.  5  . COLLAGEN PO Take by mouth.    . Ginger, Zingiber officinalis, (GINGER PO) Take 500 mg by mouth.    . hydrochlorothiazide (HYDRODIURIL) 50 MG tablet Take 50 mg by mouth daily.    . hydrocortisone (ANUSOL-HC) 25 MG suppository Place 25 mg rectally 2 (two) times daily as needed for hemorrhoids or itching.     Marland Kitchen ibuprofen (ADVIL,MOTRIN) 200 MG tablet Take by mouth daily as needed.    Marland Kitchen levothyroxine (SYNTHROID, LEVOTHROID) 88 MCG tablet Take 88 mcg by mouth daily before breakfast.    . morphine (MSIR) 30 MG tablet Take 30 mg by mouth daily.     . norethindrone (MICRONOR,CAMILA,ERRIN) 0.35 MG tablet TAKE 1 TABLET BY MOUTH EVERY DAY 84 tablet 3  . oxyCODONE (OXY IR/ROXICODONE) 5 MG immediate release tablet  5 mg as needed.    . Polyethylene Glycol 3350 (MIRALAX PO) Take by mouth.    . temazepam (RESTORIL) 15 MG capsule Take 15-30 mg by mouth at bedtime as needed for sleep.     No current facility-administered medications for this visit.      ALLERGIES: Flagyl [metronidazole], Lyrica [pregabalin], and Macrobid [nitrofurantoin macrocrystal]  Family History  Problem Relation Age of Onset  . Cancer Maternal Grandmother 16       colon  . Cancer Mother 24       thyroid  . Thyroid disease Mother   . Thyroid disease Father   . Colon cancer Maternal Uncle 80    Social History   Socioeconomic History  . Marital status: Divorced    Spouse name: Not on file  . Number of children: Not on file  . Years of education: Not on file  . Highest education level: Not on file  Occupational  History  . Not on file  Social Needs  . Financial resource strain: Not on file  . Food insecurity    Worry: Not on file    Inability: Not on file  . Transportation needs    Medical: Not on file    Non-medical: Not on file  Tobacco Use  . Smoking status: Former Smoker    Packs/day: 0.50    Years: 22.00    Pack years: 11.00    Quit date: 12/23/2014    Years since quitting: 3.6  . Smokeless tobacco: Never Used  Substance and Sexual Activity  . Alcohol use: No    Alcohol/week: 0.0 standard drinks  . Drug use: Never  . Sexual activity: Yes    Partners: Male    Birth control/protection: Pill  Lifestyle  . Physical activity    Days per week: Not on file    Minutes per session: Not on file  . Stress: Not on file  Relationships  . Social Herbalist on phone: Not on file    Gets together: Not on file    Attends religious service: Not on file    Active member of club or organization: Not on file    Attends meetings of clubs or organizations: Not on file    Relationship status: Not on file  . Intimate partner violence    Fear of current or ex partner: Not on file    Emotionally abused: Not on file    Physically abused: Not on file    Forced sexual activity: Not on file  Other Topics Concern  . Not on file  Social History Narrative  . Not on file    Review of Systems  Constitutional: Negative.   HENT: Negative.   Eyes: Negative.   Respiratory: Negative.   Cardiovascular: Negative.   Gastrointestinal: Negative.   Endocrine: Negative.   Genitourinary: Negative.   Musculoskeletal: Negative.   Skin: Negative.   Allergic/Immunologic: Negative.   Neurological: Negative.   Hematological: Negative.   Psychiatric/Behavioral: Negative.     PHYSICAL EXAMINATION:    BP 100/68 (BP Location: Right Arm, Patient Position: Sitting, Cuff Size: Normal)   Pulse 88   Temp 98.1 F (36.7 C) (Temporal)   Resp 12   Ht 4' 11.25" (1.505 m)   Wt 133 lb 12.8 oz (60.7 kg)    LMP 07/07/2018 (Exact Date)   BMI 26.80 kg/m     General appearance: alert, cooperative and appears stated age  Pelvic US 3 fibroids all close to 1 cm.  EMS 1.58 mm.  Normal ovaries.  No free fluid.  ASSESSMENT  Pelvic pain post intercourse. I suspect patient has atrophy and a possible urethritis.  Fibroids.  PLAN  We discussed fibroids and vaginal atrophy.  Benign nature of fibroids, symptoms, and natural history reviewed. We discussed options for treating the atrophy.  Will start vaginal estrogen 1/2 gram pv at hs for 2 weeks and then 1/2 gram pv at hs twice weekly. I reviewed potential effect on breast cancer.  We discussed pelvic pain and bladder pain syndromes and treatments with medications such as Gabapentin, lidocaine, Elmiron, and pelvic floor therapy.  No pelvic floor referral or urology referral at this time. Fu in 6 weeks for a web based visit.   An After Visit Summary was printed and given to the patient.  __25____ minutes face to face time of which over 50% was spent in counseling.

## 2018-08-05 ENCOUNTER — Telehealth: Payer: Self-pay | Admitting: Obstetrics and Gynecology

## 2018-08-05 NOTE — Telephone Encounter (Signed)
Attempted to reach patient to schedule her 6 weeks  for web based visit with  Dr. Quincy Simmonds, Mailbox full.

## 2018-08-05 NOTE — Telephone Encounter (Signed)
Patient has some questions about the estrogen cream that was sent to her pharmacy. She stated the cream was too expensive and would like to research on GoodRx. She also said Dr.Silva mentioned possibly getting this estrogen cream through a compound pharmacy.

## 2018-08-05 NOTE — Telephone Encounter (Signed)
Spoke with patient. Patient requesting Rx for compounded vaginal estrogen cream to [facility]. Advised I will review with Dr. Quincy Simmonds and notify once Rx has been faxed. Advised patient to allow 48 hours for medication to be prepared. Pharmacy will contact you directly once Rx is available. Patient verbalizes understanding.    Rx pended. Dr. Quincy Simmonds please clarify Rx instructions for compounded vaginal cream.

## 2018-08-06 MED ORDER — NONFORMULARY OR COMPOUNDED ITEM: 1 refills | Status: DC

## 2018-08-06 NOTE — Telephone Encounter (Signed)
[facility] for vaginal estradiol cream 0.02%.  Place 1/2 gram pv at hs for 2 weeks.  Then place 1/2 gram pv at hs 2 times per week. Dispense:  8 gram tube RF: until annual exam is due.

## 2018-08-06 NOTE — Telephone Encounter (Signed)
Rx faxed to [facility].   Call to patient, no answer, voicemail full.   Patient notified of refill via [place].   Encounter closed

## 2018-08-06 NOTE — Telephone Encounter (Signed)
Appointment completed 08/04/2018. Will close encounter

## 2018-08-06 NOTE — Telephone Encounter (Signed)
Rx printed and to Dr. Quincy Simmonds to sign.

## 2018-08-24 ENCOUNTER — Other Ambulatory Visit: Payer: Self-pay

## 2018-08-24 NOTE — Telephone Encounter (Signed)
Medication refill request: errin 0.35mg  tablet Last AEX:  08-27-17 Next AEX: 09-02-2018 Last MMG (if hormonal medication request): 09-24-17 category c density birads 1:neg Refill authorized: please approve if appropriate

## 2018-08-25 ENCOUNTER — Telehealth: Payer: Self-pay | Admitting: Obstetrics and Gynecology

## 2018-08-25 MED ORDER — NORETHINDRONE 0.35 MG PO TABS: 1.0000 | ORAL_TABLET | Freq: Every day | ORAL | 0 refills | Status: DC

## 2018-08-25 NOTE — Telephone Encounter (Signed)
Patient has yeast symptoms and wondering if the estrogen creme she's taking now could be why.

## 2018-08-25 NOTE — Telephone Encounter (Signed)
Spoke with patient, advised as seen below per Dr. Silva.  Patient verbalizes understanding and is agreeable.  Encounter closed.  

## 2018-08-25 NOTE — Telephone Encounter (Signed)
Spoke with patient. Patient just completed 2 wks of vaginal estrogen cream nightly, reports vaginal itching, irritation, burning and slight odor. Is unsure if the d/c is the cream or vaginal d/c. Denies bleeding. Used OTC monistat 1 day approximately 3 days ago, no relief. Has not used vaginal since Sunday and has not been SA.   Advised OV needed for further evaluation, patient declined. Patient has a virtual f/u on 9/3, states she has been in the office several times. Currently in [facility], her dad is waiting for surgery for aortic aneurysm, unable to travel back right now. Patient asking if Dr. Quincy Simmonds can send in RX for diflucan? Advised I will forward to Dr. Quincy Simmonds to review, pharmacy updated.   Dr. Quincy Simmonds -please review and advise.

## 2018-08-25 NOTE — Telephone Encounter (Signed)
I recommend she do an additional 3 days of Monistat therapy.  She can stop the vaginal estrogen while she is treating with Monistat.  When she resumes the vaginal estrogen, she can use it for 2 nights per week.  She should be seen for evaluation and treatment of possible bacterial vaginosis if her symptoms don't improve.  Her testing lately has been positive for BV.

## 2018-08-31 ENCOUNTER — Ambulatory Visit: Payer: Medicare HMO | Admitting: Obstetrics and Gynecology

## 2018-09-01 ENCOUNTER — Other Ambulatory Visit: Payer: Medicare Other

## 2018-09-02 ENCOUNTER — Ambulatory Visit: Payer: Medicare HMO | Admitting: Certified Nurse Midwife

## 2018-09-16 NOTE — Progress Notes (Signed)
GYNECOLOGY  VISIT   HPI: 48 y.o.   Divorced  Caucasian  female   G0P0000 with No LMP recorded. (Menstrual status: Oral contraceptives).   here for 6 week follow-up after starting vaginal estrogen cream.  She gives permission for the visit today.  She is in her kitchen.  I am in my private office. Started 4:03. Ended 4:17.  On Micronor.   She states she feels like she got a yeast infection.  She experienced burning and itching.  She really felt like she wants to scratch it.  She tried a 1 day Monistat which did not resolve it.  She did a 3 day course of Monistat and it is improved but not resolved.  She is using external cream.   She noticed some improvement on her bladder pressure symptoms.  Now this is more prevalent again that she is not using the cream every night.   She had intercourse and she still had some irritation due to her current presumed yeast infection.   No new exposures.   GYNECOLOGIC HISTORY: No LMP recorded. (Menstrual status: Oral contraceptives). Contraception: Micronor Menopausal hormone therapy: Estrogen cream Last mammogram: 09-24-17 Neg/density c/BiRads1 Last pap smear:  08/27/17 Negative, 05-16-14 Neg:Neg HR HPV        OB History    Gravida  0   Para  0   Term  0   Preterm  0   AB  0   Living  0     SAB  0   TAB  0   Ectopic  0   Multiple  0   Live Births  0              Patient Active Problem List   Diagnosis Date Noted  . 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 HEADACHE 07/28/2008  . HYPERTHYROIDISM 07/25/2008  . HEMORRHOIDS 07/25/2008  . PAIN IN JOINT OTHER SPECIFIED SITES 07/25/2008  . NAUSEA 07/25/2008  . DIARRHEA 07/25/2008  . ABDOMINAL PAIN, UNSPECIFIED SITE 07/25/2008  . Personal history of other diseases of digestive system 07/25/2008    Past Medical History:  Diagnosis Date  . Arnold-Chiari syndrome ([place]) 2000   cause of Headaches  . Fibromyalgia syndrome 2008  . Pineal gland cyst 2001   will be having  surgery in 2015?    Past Surgical History:  Procedure Laterality Date  . CHOLECYSTECTOMY, LAPAROSCOPIC  10/05  . INTRAUTERINE DEVICE INSERTION  09/13/96, removed 4/09   paraguard  . KNEE ARTHROSCOPY Right 11/11   with lateral release    Current Outpatient Medications  Medication Sig Dispense Refill  . aMILoride (MIDAMOR) 5 MG tablet Take 5 mg by mouth daily.    . clonazePAM (KLONOPIN) 1 MG tablet Take 1 mg by mouth at bedtime as needed.  5  . COLLAGEN PO Take by mouth.    . estradiol (ESTRACE) 0.1 MG/GM vaginal cream Use 1/2 g vaginally every night for the first 2 weeks, then use 1/2 g vaginally two or three times per week as needed to maintain symptom relief. 42.5 g 2  . hydrochlorothiazide (HYDRODIURIL) 50 MG tablet Take 50 mg by mouth daily.    . hydrocortisone (ANUSOL-HC) 25 MG suppository Place 25 mg rectally 2 (two) times daily as needed for hemorrhoids or itching.     Marland Kitchen ibuprofen (ADVIL,MOTRIN) 200 MG tablet Take by mouth daily as needed.    Marland Kitchen levothyroxine (SYNTHROID, LEVOTHROID) 88 MCG tablet Take 88 mcg by mouth daily before breakfast.    . morphine (MSIR)  30 MG tablet Take 30 mg by mouth daily.     . NONFORMULARY OR COMPOUNDED ITEM Estradiol 0.02% compounded vaginal cream. Place 1/2 gram vaginally at hs for 2 weeks. Then place 1/2 gram vaginally at hs 2 times per week. Dispense 8 gram tube. 1 each 1  . norethindrone (MICRONOR) 0.35 MG tablet Take 1 tablet (0.35 mg total) by mouth daily. 28 tablet 0  . oxyCODONE (OXY IR/ROXICODONE) 5 MG immediate release tablet 5 mg as needed.    . Polyethylene Glycol 3350 (MIRALAX PO) Take by mouth.    . temazepam (RESTORIL) 15 MG capsule Take 15-30 mg by mouth at bedtime as needed for sleep.    . Acetylcysteine (N-ACETYL-L-CYSTEINE PO) Take by mouth 2 (two) times daily.    . Ginger, Zingiber officinalis, (GINGER PO) Take 500 mg by mouth.     No current facility-administered medications for this visit.      ALLERGIES: Flagyl  [metronidazole], Lyrica [pregabalin], and Macrobid [nitrofurantoin macrocrystal]  Family History  Problem Relation Age of Onset  . Cancer Maternal Grandmother 78       colon  . Cancer Mother 34       thyroid  . Thyroid disease Mother   . Thyroid disease Father   . Colon cancer Maternal Uncle 78    Social History   Socioeconomic History  . Marital status: Divorced    Spouse name: Not on file  . Number of children: Not on file  . Years of education: Not on file  . Highest education level: Not on file  Occupational History  . Not on file  Social Needs  . Financial resource strain: Not on file  . Food insecurity    Worry: Not on file    Inability: Not on file  . Transportation needs    Medical: Not on file    Non-medical: Not on file  Tobacco Use  . Smoking status: Former Smoker    Packs/day: 0.50    Years: 22.00    Pack years: 11.00    Quit date: 12/23/2014    Years since quitting: 3.7  . Smokeless tobacco: Never Used  Substance and Sexual Activity  . Alcohol use: No    Alcohol/week: 0.0 standard drinks  . Drug use: Never  . Sexual activity: Yes    Partners: Male    Birth control/protection: Pill  Lifestyle  . Physical activity    Days per week: Not on file    Minutes per session: Not on file  . Stress: Not on file  Relationships  . Social Herbalist on phone: Not on file    Gets together: Not on file    Attends religious service: Not on file    Active member of club or organization: Not on file    Attends meetings of clubs or organizations: Not on file    Relationship status: Not on file  . Intimate partner violence    Fear of current or ex partner: Not on file    Emotionally abused: Not on file    Physically abused: Not on file    Forced sexual activity: Not on file  Other Topics Concern  . Not on file  Social History Narrative  . Not on file    Review of Systems  Constitutional: Negative.   HENT: Negative.   Eyes: Negative.    Respiratory: Negative.   Cardiovascular: Negative.   Gastrointestinal: Negative.   Endocrine: Negative.   Genitourinary: Negative.   Musculoskeletal:  Negative.   Skin: Negative.   Allergic/Immunologic: Negative.   Neurological: Negative.   Hematological: Negative.   Psychiatric/Behavioral: Negative.     PHYSICAL EXAMINATION:    There were no vitals taken for this visit.    General appearance: alert, cooperative and appears stated age  ASSESSMENT  Acute vaginitis.  Vaginal atrophy. Negative UCs in office.  PLAN  Diflucan 150 mg po x 1.  Repeat in 72 hours.  Continue estradiol cream 1/2 gram twice weekly. If she continue with irritative symptoms, I would recommend an office visit for a Nuswab to rule out other forms of vaginitis including Candida Glabrata.  We did discuss other vaginal cream formulations as well. If her pelvic pain and bladder pressure persist, she may benefit from a urology consult and pelvic floor therapy.  She has an annual exam appointment with Debbie in the near future.    An After Visit Summary was printed and given to the patient.  __14____ minutes face to face time of which over 50% was spent in counseling.

## 2018-09-17 ENCOUNTER — Telehealth (INDEPENDENT_AMBULATORY_CARE_PROVIDER_SITE_OTHER): Payer: Medicare Other | Admitting: Obstetrics and Gynecology

## 2018-09-17 ENCOUNTER — Encounter: Payer: Self-pay | Admitting: Obstetrics and Gynecology

## 2018-09-17 ENCOUNTER — Other Ambulatory Visit: Payer: Self-pay | Admitting: Obstetrics and Gynecology

## 2018-09-17 ENCOUNTER — Telehealth: Payer: Medicare Other | Admitting: Obstetrics and Gynecology

## 2018-09-17 DIAGNOSIS — N952 Postmenopausal atrophic vaginitis: Secondary | ICD-10-CM

## 2018-09-17 DIAGNOSIS — N760 Acute vaginitis: Secondary | ICD-10-CM | POA: Diagnosis not present

## 2018-09-17 MED ORDER — FLUCONAZOLE 150 MG PO TABS: 150.0000 mg | ORAL_TABLET | Freq: Once | ORAL | 0 refills | Status: AC

## 2018-09-17 NOTE — Telephone Encounter (Signed)
Medication refill request: Micronor Last AEX:  08/27/2017 DL Next AEX: 10/05/2018 Last MMG (if hormonal medication request): BIRADS 1 Negative Density C Refill authorized: Pending authorization. #28 with no refills if appropriate. Please advise. DL out of office until 09/21/2018.

## 2018-09-22 ENCOUNTER — Other Ambulatory Visit: Payer: Self-pay | Admitting: Obstetrics and Gynecology

## 2018-09-22 DIAGNOSIS — Z1231 Encounter for screening mammogram for malignant neoplasm of breast: Secondary | ICD-10-CM

## 2018-10-05 ENCOUNTER — Ambulatory Visit: Payer: Medicare Other | Admitting: Certified Nurse Midwife

## 2018-10-08 ENCOUNTER — Other Ambulatory Visit: Payer: Self-pay | Admitting: Obstetrics & Gynecology

## 2018-10-08 NOTE — Telephone Encounter (Signed)
Medication refill request: Micronor Last AEX:  08/27/2017 BS Next AEX: 10/26/2018 Last MMG (if hormonal medication request): 09/24/2017 BIRADS 1 Negative Density C Refill authorized: Pending #28 with no refills if appropriate.

## 2018-10-22 ENCOUNTER — Other Ambulatory Visit: Payer: Self-pay

## 2018-10-26 ENCOUNTER — Ambulatory Visit: Payer: Medicare Other | Admitting: Certified Nurse Midwife

## 2018-10-26 NOTE — Progress Notes (Deleted)
48 y.o. G0P0000 Divorced  {Race/ethnicity:17218} Fe here for annual exam.    No LMP recorded. (Menstrual status: Oral contraceptives).          Sexually active: {yes no:314532}  The current method of family planning is {contraception:315051}.    Exercising: {yes no:314532}  {types:19826} Smoker:  {YES NO:22349}  ROS  Health Maintenance: Pap:  05-16-14 neg HPV HR neg, 08-27-17 neg History of Abnormal Pap: {YES NO:22349} MMG:  09-24-17 category c density birads 1:neg Self Breast exams: {YES NO:22349} Colonoscopy:  2010 normal BMD:   none TDaP:  *** Shingles: *** Pneumonia: *** Hep C and HIV: both neg 2019 Labs: ***   reports that she quit smoking about 3 years ago. She has a 11.00 pack-year smoking history. She has never used smokeless tobacco. She reports that she does not drink alcohol or use drugs.  Past Medical History:  Diagnosis Date  . Arnold-Chiari syndrome ([place]) 2000   cause of Headaches  . Fibromyalgia syndrome 2008  . Pineal gland cyst 2001   will be having surgery in 2015?    Past Surgical History:  Procedure Laterality Date  . CHOLECYSTECTOMY, LAPAROSCOPIC  10/05  . INTRAUTERINE DEVICE INSERTION  09/13/96, removed 4/09   paraguard  . KNEE ARTHROSCOPY Right 11/11   with lateral release    Current Outpatient Medications  Medication Sig Dispense Refill  . Acetylcysteine (N-ACETYL-L-CYSTEINE PO) Take by mouth 2 (two) times daily.    Marland Kitchen aMILoride (MIDAMOR) 5 MG tablet Take 5 mg by mouth daily.    . clonazePAM (KLONOPIN) 1 MG tablet Take 1 mg by mouth at bedtime as needed.  5  . COLLAGEN PO Take by mouth.    . estradiol (ESTRACE) 0.1 MG/GM vaginal cream Use 1/2 g vaginally every night for the first 2 weeks, then use 1/2 g vaginally two or three times per week as needed to maintain symptom relief. 42.5 g 2  . Ginger, Zingiber officinalis, (GINGER PO) Take 500 mg by mouth.    . hydrochlorothiazide (HYDRODIURIL) 50 MG tablet Take 50 mg by mouth daily.    .  hydrocortisone (ANUSOL-HC) 25 MG suppository Place 25 mg rectally 2 (two) times daily as needed for hemorrhoids or itching.     Marland Kitchen ibuprofen (ADVIL,MOTRIN) 200 MG tablet Take by mouth daily as needed.    Marland Kitchen levothyroxine (SYNTHROID, LEVOTHROID) 88 MCG tablet Take 88 mcg by mouth daily before breakfast.    . morphine (MSIR) 30 MG tablet Take 30 mg by mouth daily.     . NONFORMULARY OR COMPOUNDED ITEM Estradiol 0.02% compounded vaginal cream. Place 1/2 gram vaginally at hs for 2 weeks. Then place 1/2 gram vaginally at hs 2 times per week. Dispense 8 gram tube. 1 each 1  . norethindrone (MICRONOR) 0.35 MG tablet TAKE 1 TABLET BY MOUTH EVERY DAY 28 tablet 0  . oxyCODONE (OXY IR/ROXICODONE) 5 MG immediate release tablet 5 mg as needed.    . Polyethylene Glycol 3350 (MIRALAX PO) Take by mouth.    . temazepam (RESTORIL) 15 MG capsule Take 15-30 mg by mouth at bedtime as needed for sleep.     No current facility-administered medications for this visit.     Family History  Problem Relation Age of Onset  . Cancer Maternal Grandmother 70       colon  . Cancer Mother 46       thyroid  . Thyroid disease Mother   . Thyroid disease Father   . Colon cancer Maternal Uncle 67  ROS:  Pertinent items are noted in HPI.  Otherwise, a comprehensive ROS was negative.  Exam:   There were no vitals taken for this visit.   Ht Readings from Last 3 Encounters:  08/04/18 4' 11.25" (1.505 m)  03/23/18 4' 11.25" (1.505 m)  09/17/17 4' 11.25" (1.505 m)    General appearance: alert, cooperative and appears stated age Head: Normocephalic, without obvious abnormality, atraumatic Neck: no adenopathy, supple, symmetrical, trachea midline and thyroid {EXAM; THYROID:18604} Lungs: clear to auscultation bilaterally Breasts: {Exam; breast:13139::"normal appearance, no masses or tenderness"} Heart: regular rate and rhythm Abdomen: soft, non-tender; no masses,  no organomegaly Extremities: extremities normal,  atraumatic, no cyanosis or edema Skin: Skin color, texture, turgor normal. No rashes or lesions Lymph nodes: Cervical, supraclavicular, and axillary nodes normal. No abnormal inguinal nodes palpated Neurologic: Grossly normal   Pelvic: External genitalia:  no lesions              Urethra:  normal appearing urethra with no masses, tenderness or lesions              Bartholin's and Skene's: normal                 Vagina: normal appearing vagina with normal color and discharge, no lesions              Cervix: {exam; cervix:14595}              Pap taken: {yes no:314532} Bimanual Exam:  Uterus:  {exam; uterus:12215}              Adnexa: {exam; adnexa:12223}               Rectovaginal: Confirms               Anus:  normal sphincter tone, no lesions  Chaperone present: ***  A:  Well Woman with normal exam  P:   Reviewed health and wellness pertinent to exam  Pap smear: {YES NO:22349}  {plan; gyn:5269::"mammogram","pap smear","return annually or prn"}  An After Visit Summary was printed and given to the patient.

## 2018-10-27 DIAGNOSIS — G935 Compression of brain: Secondary | ICD-10-CM | POA: Insufficient documentation

## 2018-10-27 DIAGNOSIS — G8929 Other chronic pain: Secondary | ICD-10-CM | POA: Insufficient documentation

## 2018-10-27 DIAGNOSIS — R519 Headache, unspecified: Secondary | ICD-10-CM | POA: Insufficient documentation

## 2018-10-27 DIAGNOSIS — F1190 Opioid use, unspecified, uncomplicated: Secondary | ICD-10-CM | POA: Insufficient documentation

## 2018-10-27 DIAGNOSIS — Z9889 Other specified postprocedural states: Secondary | ICD-10-CM | POA: Insufficient documentation

## 2018-10-27 DIAGNOSIS — Z79899 Other long term (current) drug therapy: Secondary | ICD-10-CM | POA: Insufficient documentation

## 2018-11-02 ENCOUNTER — Ambulatory Visit (INDEPENDENT_AMBULATORY_CARE_PROVIDER_SITE_OTHER): Payer: Medicare Other | Admitting: Certified Nurse Midwife

## 2018-11-02 ENCOUNTER — Other Ambulatory Visit: Payer: Self-pay

## 2018-11-02 ENCOUNTER — Encounter: Payer: Self-pay | Admitting: Certified Nurse Midwife

## 2018-11-02 VITALS — BP 98/60 | HR 60 | Temp 97.2°F | Resp 16 | Ht 59.25 in | Wt 130.0 lb

## 2018-11-02 DIAGNOSIS — R350 Frequency of micturition: Secondary | ICD-10-CM

## 2018-11-02 DIAGNOSIS — N941 Unspecified dyspareunia: Secondary | ICD-10-CM

## 2018-11-02 DIAGNOSIS — Z3041 Encounter for surveillance of contraceptive pills: Secondary | ICD-10-CM | POA: Diagnosis not present

## 2018-11-02 DIAGNOSIS — N898 Other specified noninflammatory disorders of vagina: Secondary | ICD-10-CM

## 2018-11-02 DIAGNOSIS — Z01419 Encounter for gynecological examination (general) (routine) without abnormal findings: Secondary | ICD-10-CM | POA: Diagnosis not present

## 2018-11-02 DIAGNOSIS — N390 Urinary tract infection, site not specified: Secondary | ICD-10-CM

## 2018-11-02 MED ORDER — PREMARIN 0.625 MG/GM VA CREA: TOPICAL_CREAM | VAGINAL | 4 refills | Status: DC

## 2018-11-02 NOTE — Patient Instructions (Signed)

## 2018-11-02 NOTE — Progress Notes (Signed)
48 y.o. G0P0000 Divorced  Caucasian [name] here for annual exam. Periods scant to none with Micronor use for contraception. Continues to have post coital urinary frequency. Not sure if she has infection today. Please check. Compounded estrogen cream working well for dryness issues. Has insurance now and would like to have Rx(which would need to be brand name Premarin) closer to home.  Sees PCP Dr. Janene Thomas once yearly for hypothyroid, Klonopin, Restoril management., labs. No other health issues today.  No LMP recorded. (Menstrual status: Oral contraceptives).          Sexually active: No.  The current method of family planning is oral progesterone-only contraceptive.    Exercising: Yes.    some yoga Smoker:  no  Review of Systems  Constitutional: Negative.   HENT: Negative.   Eyes: Negative.   Respiratory: Negative.   Cardiovascular: Negative.   Gastrointestinal: Negative.   Genitourinary: Negative.   Musculoskeletal: Negative.   Skin: Negative.   Neurological: Negative.   Endo/Heme/Allergies: Negative.   Psychiatric/Behavioral: Negative.     Health Maintenance: Pap:  05-16-14 neg HPV HR neg, 08-27-17 neg History of Abnormal Pap: no MMG:  09-24-17 category c density birads 1:neg Self Breast exams: occ Colonoscopy:  2010 f/u age 87 BMD:   none TDaP:  unsure Shingles: not done Pneumonia: not done Hep C and HIV: both neg 2019 Labs: no   reports that she quit smoking about 3 years ago. She has a 11.00 pack-year smoking history. She has never used smokeless tobacco. She reports that she does not drink alcohol or use drugs.  Past Medical History:  Diagnosis Date  . Arnold-Chiari syndrome ([place]) 2000   cause of Headaches  . Fibromyalgia syndrome 2008  . Pineal gland cyst 2001   will be having surgery in 2015?    Past Surgical History:  Procedure Laterality Date  . CHOLECYSTECTOMY, LAPAROSCOPIC  10/05  . INTRAUTERINE DEVICE INSERTION  09/13/96, removed 4/09   paraguard  . KNEE  ARTHROSCOPY Right 11/11   with lateral release    Current Outpatient Medications  Medication Sig Dispense Refill  . Acetylcysteine (N-ACETYL-L-CYSTEINE PO) Take by mouth 2 (two) times daily.    Marland Kitchen aMILoride (MIDAMOR) 5 MG tablet Take 5 mg by mouth daily.    . clonazePAM (KLONOPIN) 1 MG tablet Take 1 mg by mouth at bedtime as needed.  5  . COLLAGEN PO Take by mouth.    . estradiol (ESTRACE) 0.1 MG/GM vaginal cream Use 1/2 g vaginally every night for the first 2 weeks, then use 1/2 g vaginally two or three times per week as needed to maintain symptom relief. 42.5 g 2  . Ginger, Zingiber officinalis, (GINGER PO) Take 500 mg by mouth.    . hydrochlorothiazide (HYDRODIURIL) 50 MG tablet Take 50 mg by mouth daily.    . hydrocortisone (ANUSOL-HC) 25 MG suppository Place 25 mg rectally 2 (two) times daily as needed for hemorrhoids or itching.     Marland Kitchen ibuprofen (ADVIL,MOTRIN) 200 MG tablet Take by mouth daily as needed.    Marland Kitchen levothyroxine (SYNTHROID, LEVOTHROID) 88 MCG tablet Take 88 mcg by mouth daily before breakfast.    . morphine (MSIR) 30 MG tablet Take 30 mg by mouth daily.     . NONFORMULARY OR COMPOUNDED ITEM Estradiol 0.02% compounded vaginal cream. Place 1/2 gram vaginally at hs for 2 weeks. Then place 1/2 gram vaginally at hs 2 times per week. Dispense 8 gram tube. 1 each 1  . norethindrone (MICRONOR) 0.35 MG  tablet TAKE 1 TABLET BY MOUTH EVERY DAY 28 tablet 0  . oxyCODONE (OXY IR/ROXICODONE) 5 MG immediate release tablet 5 mg as needed.    . Polyethylene Glycol 3350 (MIRALAX PO) Take by mouth.    . temazepam (RESTORIL) 15 MG capsule Take 15-30 mg by mouth at bedtime as needed for sleep.     No current facility-administered medications for this visit.     Family History  Problem Relation Age of Onset  . Cancer Maternal Grandmother 64       colon  . Cancer Mother 1       thyroid  . Thyroid disease Mother   . Thyroid disease Father   . Colon cancer Maternal Uncle 61    ROS:   Pertinent items are noted in HPI.  Otherwise, a comprehensive ROS was negative.  Exam:   There were no vitals taken for this visit.   Ht Readings from Last 3 Encounters:  08/04/18 4' 11.25" (1.505 m)  03/23/18 4' 11.25" (1.505 m)  09/17/17 4' 11.25" (1.505 m)    General appearance: alert, cooperative and appears stated age Head: Normocephalic, without obvious abnormality, atraumatic Neck: no adenopathy, supple, symmetrical, trachea midline and thyroid normal to inspection and palpation Lungs: clear to auscultation bilaterally Breasts: normal appearance, no masses or tenderness, No nipple retraction or dimpling, No nipple discharge or bleeding, No axillary or supraclavicular adenopathy Heart: regular rate and rhythm Abdomen: soft, non-tender; no masses,  no organomegaly Extremities: extremities normal, atraumatic, no cyanosis or edema Skin: Skin color, texture, turgor normal. No rashes or lesions Lymph nodes: Cervical, supraclavicular, and axillary nodes normal. No abnormal inguinal nodes palpated Neurologic: Grossly normal   Pelvic: External genitalia:  no lesions              Urethra:  normal appearing urethra with no masses, tenderness or lesions              Bartholin's and Skene's: normal                 Vagina: normal appearing vagina with normal color and discharge, no lesions              Cervix: no cervical motion tenderness, no lesions and normal appearance              Pap taken: No. Bimanual Exam:  Uterus:  normal size, contour, position, consistency, mobility, non-tender              Adnexa: normal adnexa and no mass, fullness, tenderness               Rectovaginal: Confirms               Anus:  normal sphincter tone, no lesions  Chaperone present: yes  A:  Well Woman with normal exam  Contraception Micronor working well desires continuance  Vaginal dryness estrogen cream working well, would like to have Rx for Premarin to see if cost is better at her pharmacy.(  previous compound pharmacy)  R/O UTI, history of post coital  Hypothyroid with PCP management  History of Chiari malformation with MD management if needed  P:   Reviewed health and wellness pertinent to exam  Risks/benefits/warning signs of Micronor and bleeding expectations reviewed.  Rx Micronor to be filled once mammogram in  Risks/benefits/warning signs with Premarin use reviewed, desires continuance  Rx Premarin see order with instructions  Lab: Urine culture will treat if indicated  Continue follow up with MD as indicated.  Pap smear: no   counseled on breast self exam, mammography screening, STD prevention, HIV risk factors and prevention, feminine hygiene, use and side effects of OCP's, adequate intake of calcium and vitamin D, diet and exercise  return annually or prn  An After Visit Summary was printed and given to the patient.

## 2018-11-04 LAB — URINE CULTURE

## 2018-11-05 ENCOUNTER — Other Ambulatory Visit: Payer: Self-pay | Admitting: Certified Nurse Midwife

## 2018-11-05 MED ORDER — NORETHINDRONE 0.35 MG PO TABS: 1.0000 | ORAL_TABLET | Freq: Every day | ORAL | 0 refills | Status: DC

## 2018-11-05 NOTE — Telephone Encounter (Signed)
Will renew one pack until mammogram in

## 2018-11-05 NOTE — Telephone Encounter (Signed)
Medication refill request: OCP Last AEX:  11/02/18 DL Next AEX: none scheduled Last MMG (if hormonal medication request): 09/24/17 BIRADS 1 negative/density c  Refill authorized: Please advise  Patient has not had Mammogram -- scheduled 11/09/18

## 2018-11-05 NOTE — Telephone Encounter (Signed)
Patient needs refill on birth control. States Dr. Sabra Heck wanted to wait until after she had mammogram completed to refill. Mammogram scheduled for 11/09/18. Patient only has enough birth control for today and tomorrow and then she will be out. Pharmacy on file.

## 2018-11-05 NOTE — Telephone Encounter (Signed)
Patient has been notified as seen below.

## 2018-11-09 ENCOUNTER — Other Ambulatory Visit: Payer: Self-pay | Admitting: *Deleted

## 2018-11-09 ENCOUNTER — Other Ambulatory Visit: Payer: Self-pay

## 2018-11-09 ENCOUNTER — Ambulatory Visit
Admission: RE | Admit: 2018-11-09 | Discharge: 2018-11-09 | Disposition: A | Discharge status: Home / Self Care | Payer: Medicare HMO | Source: Ambulatory Visit | Attending: Obstetrics and Gynecology | Admitting: Obstetrics and Gynecology

## 2018-11-09 DIAGNOSIS — Z1231 Encounter for screening mammogram for malignant neoplasm of breast: Secondary | ICD-10-CM

## 2018-11-09 MED ORDER — NONFORMULARY OR COMPOUNDED ITEM: 3 refills | Status: DC

## 2018-11-09 NOTE — Telephone Encounter (Signed)
Rx pended for Compounded vaginal estradiol cream 0.02%.  Place 1/2 gram pv at hs 2 times per week. Dispense:  12 grams/ 3 refills  Melvia Heaps, CNM- please review.

## 2018-11-09 NOTE — Telephone Encounter (Signed)
Patient needs hormone cream changed to [place]. States local pharmacy was too expensive and needs change. She is in [place] today and asks if we can fill ASAP and ask pharmacy if any way to rush dispensing.  Advised compounded medication is not usually ready same day.

## 2018-11-09 NOTE — Addendum Note (Signed)
Addended by: Burnice Logan on: 11/09/2018 01:15 PM   Modules accepted: Orders

## 2018-11-09 NOTE — Telephone Encounter (Signed)
Rx faxed to [facility]. Patient notified.

## 2018-11-27 ENCOUNTER — Other Ambulatory Visit: Payer: Self-pay | Admitting: Certified Nurse Midwife

## 2018-11-27 NOTE — Telephone Encounter (Signed)
Med refill request: Micronor Last AEX: 11/02/18 Next AEX: not scheduled. Last MMG (if hormonal med) 11/09/18 , Birads 1, Negative Refill authorized: 28 tabs/ 11 RF, orders pended, if approved.

## 2019-01-20 DIAGNOSIS — F5104 Psychophysiologic insomnia: Secondary | ICD-10-CM | POA: Insufficient documentation

## 2019-04-05 ENCOUNTER — Encounter: Payer: Self-pay | Admitting: Certified Nurse Midwife

## 2019-10-27 ENCOUNTER — Other Ambulatory Visit: Payer: Self-pay | Admitting: *Deleted

## 2019-10-27 MED ORDER — NORETHINDRONE 0.35 MG PO TABS: 1.0000 | ORAL_TABLET | Freq: Every day | ORAL | 0 refills | Status: DC

## 2019-10-27 NOTE — Telephone Encounter (Signed)
Medication refill request: Norethindrone  Last AEX:  11-02-2018 DL  Next AEX: 11-29-19 BS Last MMG (if hormonal medication request): 11-09-2018 density B/BIRADS 1 negative  Refill authorized: Today, please advise.   Medication pended for #28, 0RF. Please refill if appropriate.

## 2019-11-24 ENCOUNTER — Other Ambulatory Visit: Payer: Self-pay | Admitting: Obstetrics and Gynecology

## 2019-11-24 DIAGNOSIS — Z3041 Encounter for surveillance of contraceptive pills: Secondary | ICD-10-CM

## 2019-11-24 NOTE — Telephone Encounter (Signed)
Medication refill request: Norethindrone 0.35mg   Last AEX:  11/02/18 Next AEX: 11/29/19 Last MMG (if hormonal medication request): 11/09/18  Neg  Refill authorized: 28/0

## 2019-11-26 NOTE — Progress Notes (Signed)
49 y.o. G0P0000 Divorced White or Caucasian female here for annual exam.      Hx bladder pain, never knows if she has a bladder infection or not. Today is feeling a little pelvic pressure. Denies dysuria, but has long history of it.  No LMP recorded. (Menstrual status: Oral contraceptives).   Taking Micronor. Has not had a period since she started !6-7 years ago. She is starting to have hot flashes and vaginal dryness. She never started Premarin vaginal cream as Rx'ed because too expensive.          Sexually active: Yes.    The current method of family planning is oral progesterone-only contraceptive.    Exercising: Yes.    walking & yoga Smoker:  no  Health Maintenance: Pap:  05-16-14 neg HPV HR neg, 08-27-2017 neg History of abnormal Pap:  no MMG:  11-09-2018 category b density birads 1:neg Colonoscopy:  2010 f/u age 89 BMD:   none TDaP:  unsure Gardasil:   n/a Covid-19: done Pneumonia vaccine(s):  Not done Shingrix:   n/a Hep C testing: neg 2019 poct urine-neg   reports that she quit smoking about 4 years ago. She has a 11.00 pack-year smoking history. She has never used smokeless tobacco. She reports that she does not drink alcohol and does not use drugs.  Past Medical History:  Diagnosis Date  . Arnold-Chiari syndrome ([place]) 2000   cause of Headaches  . Fibromyalgia syndrome 2008  . Pineal gland cyst 2001   will be having surgery in 2015?    Past Surgical History:  Procedure Laterality Date  . CHOLECYSTECTOMY, LAPAROSCOPIC  10/05  . INTRAUTERINE DEVICE INSERTION  09/13/96, removed 4/09   paraguard  . KNEE ARTHROSCOPY Right 11/11   with lateral release    Current Outpatient Medications  Medication Sig Dispense Refill  . clonazePAM (KLONOPIN) 1 MG tablet Take 1 mg by mouth at bedtime as needed.  5  . furosemide (LASIX) 40 MG tablet     . ibuprofen (ADVIL,MOTRIN) 200 MG tablet Take by mouth daily as needed.    Marland Kitchen levothyroxine (SYNTHROID, LEVOTHROID) 88 MCG tablet Take  88 mcg by mouth daily before breakfast.    . Magnesium Oxide 500 MG TABS Take by mouth.    . morphine (MSIR) 30 MG tablet Take 30 mg by mouth daily.     . norethindrone (MICRONOR) 0.35 MG tablet TAKE 1 TABLET BY MOUTH EVERY DAY 28 tablet 0  . oxyCODONE (OXY IR/ROXICODONE) 5 MG immediate release tablet 5 mg as needed.    . Potassium Chloride ER 20 MEQ TBCR Take by mouth.    . temazepam (RESTORIL) 15 MG capsule Take 15-30 mg by mouth at bedtime as needed for sleep.    . hydrocortisone (ANUSOL-HC) 25 MG suppository Place 25 mg rectally 2 (two) times daily as needed for hemorrhoids or itching.  (Patient not taking: Reported on 11/29/2019)    . NONFORMULARY OR COMPOUNDED ITEM Estradiol 0.02% compounded vaginal cream. Place 1/2 gram vaginally at hs for 2 weeks. Then place 1/2 gram vaginally at hs 2 times per week. Dispense 8 gram tube. (Patient not taking: Reported on 11/29/2019) 1 each 1   No current facility-administered medications for this visit.    Family History  Problem Relation Age of Onset  . Cancer Maternal Grandmother 51       colon  . Cancer Mother 67       thyroid  . Thyroid disease Mother   . Thyroid disease Father   .  Colon cancer Maternal Uncle 67    Review of Systems  Constitutional: Negative.   HENT: Negative.   Eyes: Negative.   Respiratory: Negative.   Cardiovascular: Negative.   Gastrointestinal: Negative.   Endocrine: Negative.   Genitourinary:       Pelvic pressure, urinary urgency, lower back discomfort  Musculoskeletal: Negative.   Skin: Negative.   Allergic/Immunologic: Negative.   Neurological: Negative.   Hematological: Negative.   Psychiatric/Behavioral: Negative.     Exam:   BP 118/74   Pulse 80   Resp 16   Ht 4' 11.5" (1.511 m)   Wt 112 lb (50.8 kg)   BMI 22.24 kg/m   Height: 4' 11.5" (151.1 cm)  General appearance: alert, cooperative and appears stated age Head: Normocephalic, without obvious abnormality, atraumatic Neck: no adenopathy,  supple, symmetrical, trachea midline and thyroid normal to inspection and palpation Lungs: clear to auscultation bilaterally Breasts: normal appearance, no masses or tenderness Heart: regular rate and rhythm Abdomen: soft, non-tender; bowel sounds normal; no masses,  no organomegaly Extremities: extremities normal, atraumatic, no cyanosis or edema Skin: Skin color, texture, turgor normal. No rashes or lesions Lymph nodes: Cervical, supraclavicular, and axillary nodes normal. No abnormal inguinal nodes palpated Neurologic: Grossly normal   Pelvic: External genitalia:  no lesions              Urethra:  normal appearing urethra with no masses, tenderness or lesions              Bartholins and Skenes: normal                 Vagina: normal appearing vagina with normal color and discharge, no lesions              Cervix: no cervical motion tenderness and no lesions              Pap taken: No. Bimanual Exam:  Uterus:  normal size, contour, position, consistency, mobility, non-tender              Adnexa: no mass, fullness, tenderness               Rectovaginal: Declines                  Shannon, CMA Chaperone was present for exam.  A:  Well Woman with normal exam  Contraceptive pills, POPs  Interstitial cystitis  Perimenopausal symptoms  P:   Mammogram, pt advised they are recommend every year , especially since she is on hormones pap smear due 2022  Discussed Prelief and IC diet  Start estradiol 0.05 mcg patch, in addition to Micronor  return annually or prn

## 2019-11-29 ENCOUNTER — Other Ambulatory Visit: Payer: Self-pay

## 2019-11-29 ENCOUNTER — Ambulatory Visit (INDEPENDENT_AMBULATORY_CARE_PROVIDER_SITE_OTHER): Payer: Medicare Other | Admitting: Nurse Practitioner

## 2019-11-29 ENCOUNTER — Ambulatory Visit: Payer: Medicare Other | Admitting: Obstetrics and Gynecology

## 2019-11-29 ENCOUNTER — Encounter: Payer: Self-pay | Admitting: Nurse Practitioner

## 2019-11-29 VITALS — BP 118/74 | HR 80 | Resp 16 | Ht 59.5 in | Wt 112.0 lb

## 2019-11-29 DIAGNOSIS — N3010 Interstitial cystitis (chronic) without hematuria: Secondary | ICD-10-CM

## 2019-11-29 DIAGNOSIS — Z01419 Encounter for gynecological examination (general) (routine) without abnormal findings: Secondary | ICD-10-CM

## 2019-11-29 DIAGNOSIS — Z3041 Encounter for surveillance of contraceptive pills: Secondary | ICD-10-CM | POA: Diagnosis not present

## 2019-11-29 DIAGNOSIS — N951 Menopausal and female climacteric states: Secondary | ICD-10-CM

## 2019-11-29 DIAGNOSIS — Z0000 Encounter for general adult medical examination without abnormal findings: Secondary | ICD-10-CM | POA: Diagnosis not present

## 2019-11-29 LAB — POCT URINALYSIS DIPSTICK
Bilirubin, UA: NEGATIVE
Blood, UA: NEGATIVE
Glucose, UA: NEGATIVE
Ketones, UA: NEGATIVE
Leukocytes, UA: NEGATIVE
Nitrite, UA: NEGATIVE
Protein, UA: NEGATIVE
Urobilinogen, UA: NEGATIVE E.U./dL — AB
pH, UA: 5 (ref 5.0–8.0)

## 2019-11-29 MED ORDER — NORETHINDRONE 0.35 MG PO TABS: 1.0000 | ORAL_TABLET | Freq: Every day | ORAL | 4 refills | Status: DC

## 2019-11-29 MED ORDER — ESTRADIOL 0.05 MG/24HR TD PTTW: 1.0000 | MEDICATED_PATCH | TRANSDERMAL | 12 refills | Status: DC

## 2019-11-29 NOTE — Patient Instructions (Addendum)
Considering your symptoms, it sounds to me like you have symptoms of interstitial cystitis.   Prelief (over the counter) helps to keep urine alkaline. This may help with interstitial cystitis  Eating Plan for Interstitial Cystitis Interstitial cystitis (IC) is a long-term (chronic) condition that causes pain and pressure in the bladder, the lower abdomen, and the pelvic area. Other symptoms of IC include urinary urgency and frequency. Symptoms tend to come and go. Many people with IC find that certain foods trigger their symptoms. Different foods may be problematic for different people. Some foods are more likely to cause symptoms than others. Learning which foods bother you and which do not can help you come up with an eating plan to manage IC. What are tips for following this plan? You may find it helpful to work with a dietitian. This health care provider can help you develop your eating plan by doing an elimination diet, which involves these steps:  Start with a list of foods that you think trigger your IC symptoms along with the foods that most commonly trigger symptoms for many people with IC.  Eliminate those foods from your diet for about one month, then start reintroducing the foods one at a time to see which ones trigger your symptoms.  Make a list of the foods that trigger your symptoms. It may take several months to find out which foods bother you. Reading food labels Once you know which foods trigger your IC symptoms, you can avoid them. However, it is also a good idea to read food labels because some foods that trigger your symptoms may be included as ingredients in other foods. These ingredients may include:  Chili peppers.  Tomato products.  Soy.  Worcestershire sauce.  Vinegar.  Alcohol.  Citrus flavors or juices.  Artificial sweeteners.  Monosodium glutamate. Shopping  Shopping can be a challenge if many foods trigger your IC. When you go grocery shopping, bring  a list of the foods you can eat.  You can get an app for your phone that lets you know which foods are the safest and which you may want to avoid. You can find the app at the Interstitial Cystitis Network website: www.ic-network.com Meal planning  Plan your meals according to the results of your elimination diet. If you have not done an elimination diet, plan meals according to IC food lists recommended by your health care provider or dietitian. These lists tell you which foods are least and most likely to cause symptoms.  Avoid certain types of food when you go out to eat, such as pizza and foods typically served at [place], [place], and [place]. These foods often contain ingredients that can aggravate IC. General information Here are some general guidelines for an IC eating plan:  Do not eat large portions.  Drink plenty of fluids with your meals.  Do not eat foods that are high in sugar, salt, or saturated fat.  Choose whole fruits instead of juice.  Eat a colorful variety of vegetables. What foods should I eat? For people with IC, the best diet is a balanced one that includes things from all the food groups. Even if you have to avoid certain foods, there are still plenty of healthy choices in each group. The following are some foods that are least bothersome and may be safest to eat: Fruits Bananas. Blueberries and blueberry juice. Melons. Pears. Apples. Dates. Prunes. Raisins. Apricots. Vegetables Asparagus. Avocado. Celery. Beets. Bell peppers. Black olives. Broccoli. Brussels sprouts. Cabbage. Carrots.  Cauliflower. Cucumber. Eggplant. Green beans. Potatoes. Radishes. Spinach. Squash. Turnips. Zucchini. Mushrooms. Peas. Grains Oats. Rice. Bran. Oatmeal. Whole wheat bread. Meats and other proteins Beef. Fish and other seafood. Eggs. Nuts. Peanut butter. Pork. Poultry. Lamb. Garbanzo beans. Pinto beans. Dairy Whole or low-fat milk. American, mozzarella, mild cheddar, feta,  ricotta, and cream cheeses. The items listed above may not be a complete list of foods and beverages you can eat. Contact a dietitian for more information. What foods should I avoid? You should avoid any foods that seem to trigger your symptoms. It is also a good idea to avoid foods that are most likely to cause symptoms in many people with IC. These include the following: Fruits Citrus fruits, including lemons, limes, oranges, and grapefruit. Cranberries. Strawberries. Pineapple. Kiwi. Vegetables Chili peppers. Onions. Sauerkraut. Tomato and tomato products. Angie Fava. Grains You do not need to avoid any type of grain unless it triggers your symptoms. Meats and other proteins Precooked or cured meats, such as sausages or meat loaves. Soy products. Dairy Chocolate ice cream. Processed cheese. Yogurt. Beverages Alcohol. Chocolate drinks. Coffee. Cranberry juice. Carbonated drinks. Tea (black, green, or herbal). Tomato juice. Sports drinks. The items listed above may not be a complete list of foods and beverages you should avoid. Contact a dietitian for more information. Summary  Many people with IC find that certain foods trigger their symptoms. Different foods may be problematic for different people. Some foods are more likely to cause symptoms than others.  You may find it helpful to work with a dietitian to do an elimination diet and come up with an eating plan that is right for you.  Plan your meals according to the results of your elimination diet. If you have not done an elimination diet, plan your meals using IC food lists. These lists tell you which foods are least and most likely to cause symptoms.  The best diet for people with IC is a balanced diet that includes foods from all the food groups. Even if you have to avoid certain foods, there are still plenty of healthy choices in each group. This information is not intended to replace advice given to you by your health care provider.  Make sure you discuss any questions you have with your health care provider. Document Revised: 04/23/2018 Document Reviewed: 09/04/2017 Elsevier Patient Education  2020 [employer].

## 2020-12-05 ENCOUNTER — Ambulatory Visit: Payer: Medicare Other | Admitting: Nurse Practitioner

## 2020-12-05 NOTE — Progress Notes (Deleted)
   Isabel Thomas January 20, 1970 [id number]   History:  50 y.o. G0 presents for annual exam. Amenorrheic. POPs. Was started on estradiol patch last year due to menopausal symptoms. Normal pap and mammogram history. Hypothyroidism, headaches, and HLD managed by PCP.   Gynecologic History No LMP recorded. (Menstrual status: Oral contraceptives).   Contraception/Family planning: oral progesterone-only contraceptive Sexually active: Yes  Health Maintenance Last Pap: 08/27/2017. Results were: Norma,3-year repeat Last mammogram: 11/09/2018. Results were: Normal Last colonoscopy: 07/29/2008. Results were: Normal Last Dexa: Not indicated  Past medical history, past surgical history, family history and social history were all reviewed and documented in the EPIC chart. Mother diagnosed with thyroid cancer at age 35, MGM and maternal uncle with history of colon cancer.   ROS:  A ROS was performed and pertinent positives and negatives are included.  Exam:  There were no vitals filed for this visit. There is no height or weight on file to calculate BMI.  General appearance:  Normal Thyroid:  Symmetrical, normal in size, without palpable masses or nodularity. Respiratory  Auscultation:  Clear without wheezing or rhonchi Cardiovascular  Auscultation:  Regular rate, without rubs, murmurs or gallops  Edema/varicosities:  Not grossly evident Abdominal  Soft,nontender, without masses, guarding or rebound.  Liver/spleen:  No organomegaly noted  Hernia:  None appreciated  Skin  Inspection:  Grossly normal Breasts: Examined lying and sitting.   Right: Without masses, retractions, nipple discharge or axillary adenopathy.   Left: Without masses, retractions, nipple discharge or axillary adenopathy. Genitourinary   Inguinal/mons:  Normal without inguinal adenopathy  External genitalia:  Normal appearing vulva with no masses, tenderness, or lesions  BUS/Urethra/Skene's glands:  Normal  Vagina:   Normal appearing with normal color and discharge, no lesions  Cervix:  Normal appearing without discharge or lesions  Uterus:  Normal in size, shape and contour.  Midline and mobile, nontender  Adnexa/parametria:     Rt: Normal in size, without masses or tenderness.   Lt: Normal in size, without masses or tenderness.  Anus and perineum: Normal  Digital rectal exam: Normal sphincter tone without palpated masses or tenderness  Patient informed chaperone available to be present for breast and pelvic exam. Patient has requested no chaperone to be present. Patient has been advised what will be completed during breast and pelvic exam.   Assessment/Plan:  50 y.o. G0 for annual exam.    Return in 1 year for annual.   Tamela Gammon DNP, 8:53 AM 12/05/2020

## 2020-12-11 ENCOUNTER — Ambulatory Visit: Payer: Medicare Other | Admitting: Nurse Practitioner

## 2020-12-20 ENCOUNTER — Other Ambulatory Visit: Payer: Self-pay | Admitting: *Deleted

## 2020-12-20 DIAGNOSIS — Z3041 Encounter for surveillance of contraceptive pills: Secondary | ICD-10-CM

## 2020-12-20 MED ORDER — NORETHINDRONE 0.35 MG PO TABS: 1.0000 | ORAL_TABLET | Freq: Every day | ORAL | 0 refills | Status: DC

## 2020-12-20 NOTE — Telephone Encounter (Signed)
Med refill request: Norethindrone 0.35mg  tab. Take 1 tab PO daily  Last AEX: 11/29/19, KD Next AEX: 01/25/21/ TW  Last MMG (if hormonal med): Per review of Epic and Care Everywhere, last MMG at [facility] 11/09/18: Neg, BiRads1  Routing to Marny Lowenstein, NP

## 2021-01-25 ENCOUNTER — Other Ambulatory Visit: Payer: Self-pay

## 2021-01-25 ENCOUNTER — Other Ambulatory Visit (HOSPITAL_COMMUNITY)
Admission: RE | Admit: 2021-01-25 | Discharge: 2021-01-25 | Disposition: A | Discharge status: Home / Self Care | Payer: Medicare Other | Source: Ambulatory Visit | Attending: Nurse Practitioner | Admitting: Nurse Practitioner

## 2021-01-25 ENCOUNTER — Encounter: Payer: Self-pay | Admitting: Nurse Practitioner

## 2021-01-25 ENCOUNTER — Ambulatory Visit (INDEPENDENT_AMBULATORY_CARE_PROVIDER_SITE_OTHER): Payer: Medicare Other | Admitting: Nurse Practitioner

## 2021-01-25 VITALS — BP 106/70 | Ht 59.0 in | Wt 124.0 lb

## 2021-01-25 DIAGNOSIS — Z3041 Encounter for surveillance of contraceptive pills: Secondary | ICD-10-CM | POA: Diagnosis not present

## 2021-01-25 DIAGNOSIS — N951 Menopausal and female climacteric states: Secondary | ICD-10-CM

## 2021-01-25 DIAGNOSIS — Z01419 Encounter for gynecological examination (general) (routine) without abnormal findings: Secondary | ICD-10-CM | POA: Diagnosis present

## 2021-01-25 LAB — FOLLICLE STIMULATING HORMONE: FSH: 1.4 m[IU]/mL — ABNORMAL LOW

## 2021-01-25 NOTE — Patient Instructions (Signed)
Schedule Colonoscopy! ?[facility] ?[phone] ?[address] [place], [place] [address] ? ?

## 2021-01-25 NOTE — Progress Notes (Signed)
° °  Isabel Thomas [birth year] [id number]   History:  51 y.o. G0 presents for breast and pelvic exam. Amenorrheic since starting POPs 7-8 years ago. Vivelle patch prescribed last year for management of menopausal symptoms. Normal pap history. History of fibromyalgia, IC.   Gynecologic History No LMP recorded. (Menstrual status: Oral contraceptives).   Contraception/Family planning: oral progesterone-only contraceptive Sexually active: Yes  Health Maintenance Last Pap: 08/27/2017. Results were: Normal, 3-year repeat Last mammogram: 11/09/2018. Results were: Normal Last colonoscopy: 2010. Results were: Normal, follow up at age 10 Last Dexa: Not indicated  Past medical history, past surgical history, family history and social history were all reviewed and documented in the EPIC chart. Boyfriend. On disability for brain tumor.   ROS:  A ROS was performed and pertinent positives and negatives are included.  Exam:  Vitals:   01/25/21 1341  BP: 106/70  Weight: 124 lb (56.2 kg)  Height: 4\' 11"  (1.499 m)   Body mass index is 25.04 kg/m.  General appearance:  Normal Thyroid:  Symmetrical, normal in size, without palpable masses or nodularity. Respiratory  Auscultation:  Clear without wheezing or rhonchi Cardiovascular  Auscultation:  Regular rate, without rubs, murmurs or gallops  Edema/varicosities:  Not grossly evident Abdominal  Soft,nontender, without masses, guarding or rebound.  Liver/spleen:  No organomegaly noted  Hernia:  None appreciated  Skin  Inspection:  Grossly normal Breasts: Examined lying and sitting.   Right: Without masses, retractions, nipple discharge or axillary adenopathy.   Left: Without masses, retractions, nipple discharge or axillary adenopathy. Genitourinary   Inguinal/mons:  Normal without inguinal adenopathy  External genitalia:  Normal appearing vulva with no masses, tenderness, or lesions  BUS/Urethra/Skene's glands:  Normal  Vagina:   Normal appearing with normal color and discharge, no lesions  Cervix:  Normal appearing without discharge or lesions  Uterus:  Normal in size, shape and contour.  Midline and mobile, nontender  Adnexa/parametria:     Rt: Normal in size, without masses or tenderness.   Lt: Normal in size, without masses or tenderness.  Anus and perineum: Normal  Digital rectal exam: Normal sphincter tone without palpated masses or tenderness  Patient informed chaperone available to be present for breast and pelvic exam. Patient has requested no chaperone to be present. Patient has been advised what will be completed during breast and pelvic exam.   Assessment/Plan:  51 y.o. G0 for breast and pelvic exam.   Well female exam with routine gynecological exam - Plan: Cytology - PAP( [facility]). Education provided on SBEs, importance of preventative screenings, current guidelines, high calcium diet, regular exercise, and multivitamin daily.  Labs with PCP.   Encounter for surveillance of contraceptive pills - Amenorrheic. Micronor. Will check [place] and determine if continued use appropriate.   Menopausal symptoms - Plan: Follicle stimulating hormone. On Vivelle patch for management of menopausal symptoms. If postmenopausal we will switch to micronized form of progesterone.   Screening for cervical cancer - Normal Pap history. Pap today.   Screening for breast cancer - Normal mammogram history.  Overdue. Discussed current guidelines and importance of preventative screenings, especially with HRT. She plans to schedule this now.  Normal breast exam today.  Screening for colon cancer - 2010 colonoscopy with recommendations to repeat at age 25. Information provided on [facility] and she plans to schedule this soon.   Return in 1 year for breast and pelvic.      Tamela Gammon DNP, 2:53 PM 01/25/2021

## 2021-01-26 LAB — CYTOLOGY - PAP: Diagnosis: NEGATIVE

## 2021-02-19 ENCOUNTER — Other Ambulatory Visit: Payer: Self-pay | Admitting: *Deleted

## 2021-02-19 ENCOUNTER — Other Ambulatory Visit: Payer: Self-pay | Admitting: Nurse Practitioner

## 2021-02-19 ENCOUNTER — Other Ambulatory Visit: Payer: Self-pay | Admitting: Obstetrics and Gynecology

## 2021-02-19 DIAGNOSIS — N951 Menopausal and female climacteric states: Secondary | ICD-10-CM

## 2021-02-19 DIAGNOSIS — Z1231 Encounter for screening mammogram for malignant neoplasm of breast: Secondary | ICD-10-CM

## 2021-02-19 NOTE — Telephone Encounter (Signed)
I will send refills once I get negative report.

## 2021-02-19 NOTE — Telephone Encounter (Signed)
Patient called stating she was told to schedule her mammogram and refill on estradiol patches will be sent to pharmacy. Patient is scheduled on 02/20/21. Annual exam was 01/25/21

## 2021-02-20 ENCOUNTER — Ambulatory Visit
Admission: RE | Admit: 2021-02-20 | Discharge: 2021-02-20 | Disposition: A | Discharge status: Home / Self Care | Payer: Medicare Other | Source: Ambulatory Visit | Attending: Nurse Practitioner | Admitting: Nurse Practitioner

## 2021-02-20 DIAGNOSIS — Z1231 Encounter for screening mammogram for malignant neoplasm of breast: Secondary | ICD-10-CM

## 2021-02-21 MED ORDER — ESTRADIOL 0.05 MG/24HR TD PTTW: 1.0000 | MEDICATED_PATCH | TRANSDERMAL | 3 refills | Status: DC

## 2021-02-21 NOTE — Telephone Encounter (Signed)
Patient informed. 

## 2021-03-09 ENCOUNTER — Other Ambulatory Visit: Payer: Self-pay | Admitting: Nurse Practitioner

## 2021-03-09 DIAGNOSIS — Z3041 Encounter for surveillance of contraceptive pills: Secondary | ICD-10-CM

## 2021-03-22 ENCOUNTER — Other Ambulatory Visit: Payer: Self-pay

## 2021-03-22 DIAGNOSIS — Z3041 Encounter for surveillance of contraceptive pills: Secondary | ICD-10-CM

## 2021-03-22 MED ORDER — NORETHINDRONE 0.35 MG PO TABS: 1.0000 | ORAL_TABLET | Freq: Every day | ORAL | 3 refills | Status: DC

## 2021-03-22 NOTE — Telephone Encounter (Signed)
Last AEX 01/25/21. ?Mammo UTD.  ?

## 2022-01-04 ENCOUNTER — Other Ambulatory Visit: Payer: Self-pay | Admitting: Nurse Practitioner

## 2022-01-04 DIAGNOSIS — N951 Menopausal and female climacteric states: Secondary | ICD-10-CM

## 2022-01-04 NOTE — Telephone Encounter (Signed)
Medication refill request: vivelle dot 0.'05mg'$  patch Last AEX:  01-25-21 Next AEX: not scheduled Last MMG (if hormonal medication request): 02-20-21 neg Refill authorized: placed pharmacy note for patient to call to schedule yearly exam. Please approve if appropriate  Sending to covering provider

## 2022-02-07 ENCOUNTER — Other Ambulatory Visit: Payer: Self-pay | Admitting: Nurse Practitioner

## 2022-02-07 DIAGNOSIS — Z3041 Encounter for surveillance of contraceptive pills: Secondary | ICD-10-CM

## 2022-02-08 NOTE — Telephone Encounter (Signed)
RF request received for Norethindrone 0.'35mg'$ .  Last AEX 01/25/21.  No apt. Scheduled.  RF denied.

## 2022-02-25 ENCOUNTER — Other Ambulatory Visit: Payer: Self-pay

## 2022-02-25 DIAGNOSIS — Z3041 Encounter for surveillance of contraceptive pills: Secondary | ICD-10-CM

## 2022-02-25 MED ORDER — NORETHINDRONE 0.35 MG PO TABS: 1.0000 | ORAL_TABLET | Freq: Every day | ORAL | 0 refills | Status: DC

## 2022-02-25 NOTE — Telephone Encounter (Signed)
LDVM per DPR that refill was approved for 10mosupply.

## 2022-02-25 NOTE — Telephone Encounter (Signed)
Pt calling to report needing refill on POPs, states currently has plenty patches. However, was going to run out of POPs over weekend.  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 Pt reports has plans to schedule AEX as soon as she is able to get into position for AEX without having extreme pains. Please advise.  Last AEX 01/25/2021 Last mammo 02/20/2021-neg birads 1

## 2022-04-24 ENCOUNTER — Other Ambulatory Visit: Payer: Self-pay | Admitting: Nurse Practitioner

## 2022-04-24 ENCOUNTER — Other Ambulatory Visit: Payer: Self-pay

## 2022-04-24 DIAGNOSIS — Z3041 Encounter for surveillance of contraceptive pills: Secondary | ICD-10-CM

## 2022-04-24 DIAGNOSIS — Z1231 Encounter for screening mammogram for malignant neoplasm of breast: Secondary | ICD-10-CM

## 2022-04-24 MED ORDER — NORETHINDRONE 0.35 MG PO TABS: 1.0000 | ORAL_TABLET | Freq: Every day | ORAL | 0 refills | Status: DC

## 2022-04-24 NOTE — Telephone Encounter (Signed)
Pt calling to report needing refills on BC.   Last AEX 01/25/2021--scheduled for 06/20/2022. Last mammo 02/20/2021-neg birads 1    Spoke w/ pt to confirm if she needed patches and POPs or just one or the other, she confirmed that only will need POPs--still has ~3 weeks left of her last pack.   Pt also advised during call that it is recommended for her to have her yearly mammogram as well. Last report seen was 02/2021. Pt voiced understanding, states now that her back is doing better she is trying to get all of her appts scheduled.   Rx pend.

## 2022-04-25 NOTE — Telephone Encounter (Signed)
LDVM per DPR.  ?

## 2022-05-05 ENCOUNTER — Other Ambulatory Visit: Payer: Self-pay | Admitting: Radiology

## 2022-05-05 DIAGNOSIS — N951 Menopausal and female climacteric states: Secondary | ICD-10-CM

## 2022-06-05 ENCOUNTER — Ambulatory Visit
Admission: RE | Admit: 2022-06-05 | Discharge: 2022-06-05 | Disposition: A | Discharge status: Home / Self Care | Payer: Medicare Other | Source: Ambulatory Visit | Attending: Nurse Practitioner | Admitting: Nurse Practitioner

## 2022-06-05 DIAGNOSIS — Z1231 Encounter for screening mammogram for malignant neoplasm of breast: Secondary | ICD-10-CM

## 2022-06-20 ENCOUNTER — Ambulatory Visit (INDEPENDENT_AMBULATORY_CARE_PROVIDER_SITE_OTHER): Payer: Medicare Other | Admitting: Nurse Practitioner

## 2022-06-20 ENCOUNTER — Encounter: Payer: Self-pay | Admitting: Nurse Practitioner

## 2022-06-20 VITALS — BP 106/62 | HR 78 | Wt 128.0 lb

## 2022-06-20 DIAGNOSIS — N951 Menopausal and female climacteric states: Secondary | ICD-10-CM

## 2022-06-20 DIAGNOSIS — N3281 Overactive bladder: Secondary | ICD-10-CM

## 2022-06-20 DIAGNOSIS — Z3041 Encounter for surveillance of contraceptive pills: Secondary | ICD-10-CM | POA: Diagnosis not present

## 2022-06-20 MED ORDER — ESTRADIOL 0.05 MG/24HR TD PTTW: 1.0000 | MEDICATED_PATCH | TRANSDERMAL | 3 refills | Status: DC

## 2022-06-20 MED ORDER — MIRABEGRON ER 25 MG PO TB24: 25.0000 mg | ORAL_TABLET | Freq: Every day | ORAL | 2 refills | Status: DC

## 2022-06-20 MED ORDER — NORETHINDRONE 0.35 MG PO TABS: 1.0000 | ORAL_TABLET | Freq: Every day | ORAL | 3 refills | Status: DC

## 2022-06-20 MED ORDER — OXYBUTYNIN CHLORIDE ER 5 MG PO TB24: 5.0000 mg | ORAL_TABLET | Freq: Every day | ORAL | 2 refills | Status: DC

## 2022-06-20 NOTE — Progress Notes (Signed)
   Isabel Thomas [birth year] [id number]   History:  52 y.o. G0 presents for medication management and complaints of urinary frequency. Amenorrheic on POPs for years. Vivelle patch for perimenopausal symptoms. FSH 1.4 last year but she was on hormones at that time. Complains of always feeling like she has to use the bathroom. This is not new for her. She just bought an exercise program for strengthening her pelvic floor. She voids often, 6-7 times at times, small amount of urine, no burning or incontinence. Does not feel she has bladder spasms. Normal pap history. History of fibromyalgia, IC.   Gynecologic History No LMP recorded. (Menstrual status: Oral contraceptives).   Contraception/Family planning: oral progesterone-only contraceptive Sexually active: Yes  Health Maintenance Last Pap: 01/25/2021. Results were: Normal, 3-year repeat Last mammogram: 06/05/2022. Results were: Normal Last colonoscopy: 2010. Results were: Normal Last Dexa: Not indicated  Past medical history, past surgical history, family history and social history were all reviewed and documented in the EPIC chart. Boyfriend. On disability for brain tumor.   ROS:  A ROS was performed and pertinent positives and negatives are included.  Exam:  Vitals:   06/20/22 1338  BP: 106/62  Pulse: 78  SpO2: 100%  Weight: 128 lb (58.1 kg)    Body mass index is 25.85 kg/m.  General appearance:  Normal  Assessment/Plan:  52 y.o. G0 for breast and pelvic exam.   Perimenopausal symptoms - Plan: estradiol (VIVELLE-DOT) 0.05 MG/24HR patch twice weekly. Good management. Consider switching to micronized progesterone.   Encounter for surveillance of contraceptive pills - Plan: norethindrone (MICRONOR) 0.35 MG tablet daily. Taking as prescribed.   OAB (overactive bladder) - Plan: mirabegron ER (MYRBETRIQ) 25 MG TB24 tablet daily. Continue with exercise program purchased for pelvic floor strengthening.  Return in 1 year for  breast and pelvic exam.       Olivia Mackie DNP, 1:59 PM 06/20/2022

## 2022-09-14 ENCOUNTER — Other Ambulatory Visit: Payer: Self-pay | Admitting: Nurse Practitioner

## 2022-09-14 DIAGNOSIS — N3281 Overactive bladder: Secondary | ICD-10-CM

## 2022-09-17 NOTE — Telephone Encounter (Signed)
Medication refill request: ditropan xl 5mg  Last AEX:  01-25-21 Last OV: 06-20-22 Next AEX: not scheduled Last MMG (if hormonal medication request): n/a Refill authorized: at pt's last ov, it states patient is using myrbetriq 25mg . Please deny rx for ditropan if appropriate

## 2022-10-24 ENCOUNTER — Other Ambulatory Visit: Payer: Self-pay | Admitting: Medical Genetics

## 2022-10-24 DIAGNOSIS — Z006 Encounter for examination for normal comparison and control in clinical research program: Secondary | ICD-10-CM

## 2023-03-25 ENCOUNTER — Encounter: Payer: Self-pay | Admitting: Nurse Practitioner

## 2023-03-25 ENCOUNTER — Ambulatory Visit (INDEPENDENT_AMBULATORY_CARE_PROVIDER_SITE_OTHER): Admitting: Nurse Practitioner

## 2023-03-25 VITALS — BP 114/74 | HR 77 | Ht 59.0 in | Wt 128.0 lb

## 2023-03-25 DIAGNOSIS — D280 Benign neoplasm of vulva: Secondary | ICD-10-CM | POA: Diagnosis not present

## 2023-03-25 DIAGNOSIS — N3281 Overactive bladder: Secondary | ICD-10-CM | POA: Diagnosis not present

## 2023-03-25 NOTE — Progress Notes (Signed)
   Acute Office Visit  Subjective:    Patient ID: Isabel Thomas, female    DOB: May 23, 1970, 53 y.o.   MRN: [id number]   HPI 53 y.o. presents today for left labial bumps x 2 days. No pain, no change, no drainage. Partner looked and noticed a few bumps that are darker in color. Had I&D in the past, so she wants to make sure this is not an infection. Requesting referral for pelvic floor PT for management of pelvic floor weakness and OAB. Prescribed Myrbetriq but never started.   No LMP recorded. (Menstrual status: Oral contraceptives).    Review of Systems  Constitutional: Negative.   Genitourinary:  Positive for genital sores and urgency. Negative for difficulty urinating.       Objective:    Physical Exam Constitutional:      Appearance: Normal appearance.  Genitourinary:      BP 114/74 (BP Location: Left Arm, Patient Position: Sitting, Cuff Size: Normal)   Pulse 77   Ht 4\' 11"  (1.499 m)   Wt 128 lb (58.1 kg)   SpO2 97%   BMI 25.85 kg/m  Wt Readings from Last 3 Encounters:  03/25/23 128 lb (58.1 kg)  06/20/22 128 lb (58.1 kg)  01/25/21 124 lb (56.2 kg)        Patient informed chaperone available to be present for breast and/or pelvic exam. Patient has requested no chaperone to be present. Patient has been advised what will be completed during breast and pelvic exam.   Assessment & Plan:   Problem List Items Addressed This Visit   None Visit Diagnoses       Angiokeratoma of vulva    -  Primary   Relevant Medications           OAB (overactive bladder)       Relevant Orders   Ambulatory referral to Physical Therapy      Plan: Reassurance provided on benign labial lesions. Referral for pelvic floor PT provided.   Return if symptoms worsen or fail to improve.    Olivia Mackie DNP, 11:49 AM 03/25/2023

## 2023-04-23 ENCOUNTER — Other Ambulatory Visit: Payer: Self-pay | Admitting: Nurse Practitioner

## 2023-04-23 DIAGNOSIS — Z1231 Encounter for screening mammogram for malignant neoplasm of breast: Secondary | ICD-10-CM

## 2023-04-23 DIAGNOSIS — N951 Menopausal and female climacteric states: Secondary | ICD-10-CM

## 2023-04-23 DIAGNOSIS — Z3041 Encounter for surveillance of contraceptive pills: Secondary | ICD-10-CM

## 2023-04-23 NOTE — Telephone Encounter (Signed)
 Med refill request: POPs/Patches Last AEX: 01/25/2021-TW, Med Check 06/20/2022-TW Next AEX: scheduled for 06/24/2023 Last MMG (if hormonal med): 06/05/2022-WNL, scheduled for 06/16/2023. Refill authorized: rxs pend.

## 2023-06-16 ENCOUNTER — Other Ambulatory Visit: Payer: Self-pay | Admitting: Nurse Practitioner

## 2023-06-16 ENCOUNTER — Ambulatory Visit

## 2023-06-16 DIAGNOSIS — Z3041 Encounter for surveillance of contraceptive pills: Secondary | ICD-10-CM

## 2023-06-17 NOTE — Telephone Encounter (Signed)
 Med refill request: Micronor  Last AEX: 01/25/21 TW Next AEX: 06/24/23 TW Last MMG (if hormonal med) 06/05/22 Refill authorized: Last Rx sent #[phone] with zero refills on 04/24/23 TW. Please approve or deny

## 2023-06-24 ENCOUNTER — Ambulatory Visit: Admitting: Nurse Practitioner

## 2023-06-24 ENCOUNTER — Other Ambulatory Visit (HOSPITAL_COMMUNITY)
Admission: RE | Admit: 2023-06-24 | Discharge: 2023-06-24 | Disposition: A | Discharge status: Home / Self Care | Source: Ambulatory Visit | Attending: Nurse Practitioner | Admitting: Nurse Practitioner

## 2023-06-24 ENCOUNTER — Ambulatory Visit

## 2023-06-24 ENCOUNTER — Encounter: Payer: Self-pay | Admitting: Nurse Practitioner

## 2023-06-24 VITALS — BP 112/78 | HR 83 | Ht 58.75 in | Wt 131.0 lb

## 2023-06-24 DIAGNOSIS — Z124 Encounter for screening for malignant neoplasm of cervix: Secondary | ICD-10-CM | POA: Diagnosis present

## 2023-06-24 DIAGNOSIS — N951 Menopausal and female climacteric states: Secondary | ICD-10-CM | POA: Diagnosis not present

## 2023-06-24 DIAGNOSIS — N941 Unspecified dyspareunia: Secondary | ICD-10-CM

## 2023-06-24 DIAGNOSIS — Z3041 Encounter for surveillance of contraceptive pills: Secondary | ICD-10-CM

## 2023-06-24 DIAGNOSIS — Z01419 Encounter for gynecological examination (general) (routine) without abnormal findings: Secondary | ICD-10-CM

## 2023-06-24 DIAGNOSIS — Z1331 Encounter for screening for depression: Secondary | ICD-10-CM

## 2023-06-24 DIAGNOSIS — Z1151 Encounter for screening for human papillomavirus (HPV): Secondary | ICD-10-CM | POA: Insufficient documentation

## 2023-06-24 MED ORDER — ESTRADIOL 0.05 MG/24HR TD PTTW: 1.0000 | MEDICATED_PATCH | TRANSDERMAL | 3 refills | Status: AC

## 2023-06-24 MED ORDER — ESTRADIOL 0.1 MG/GM VA CREA: 1.0000 | TOPICAL_CREAM | VAGINAL | 2 refills | Status: DC

## 2023-06-24 MED ORDER — NORETHINDRONE 0.35 MG PO TABS: 1.0000 | ORAL_TABLET | Freq: Every day | ORAL | 3 refills | Status: AC

## 2023-06-24 NOTE — Progress Notes (Signed)
 Lotus B Killings [birth year] [id number]   History:  53 y.o. G0 presents for breast and pelvic exam. Amenorrheic since starting POPs 7-8 years ago. Missed a few pills recently and had spotting. Vivelle  patch for perimenopausal symptoms. Has pain with intercourse. Normal pap history. History of fibromyalgia, IC.   Gynecologic History No LMP recorded. (Menstrual status: Oral contraceptives).   Contraception/Family planning: oral progesterone-only contraceptive Sexually active: Yes  Health Maintenance Last Pap: 01/25/2021. Results were: Normal Last mammogram: 06/24/2023 (today). Results were: Normal Last colonoscopy: 2010. Results were: Normal, follow up at age 57 Last Dexa: Not indicated     06/24/2023    3:13 PM  Depression screen PHQ 2/9  Decreased Interest 0  Down, Depressed, Hopeless 1  PHQ - 2 Score 1     Past medical history, past surgical history, family history and social history were all reviewed and documented in the EPIC chart. Boyfriend. On disability for brain tumor.   ROS:  A ROS was performed and pertinent positives and negatives are included.  Exam:  Vitals:   06/24/23 1508  BP: 112/78  Pulse: 83  SpO2: 97%  Weight: 131 lb (59.4 kg)  Height: 4' 10.75" (1.492 m)    Body mass index is 26.68 kg/m.  General appearance:  Normal Thyroid:  Symmetrical, normal in size, without palpable masses or nodularity. Respiratory  Auscultation:  Clear without wheezing or rhonchi Cardiovascular  Auscultation:  Regular rate, without rubs, murmurs or gallops  Edema/varicosities:  Not grossly evident Abdominal  Soft,nontender, without masses, guarding or rebound.  Liver/spleen:  No organomegaly noted  Hernia:  None appreciated  Skin  Inspection:  Grossly normal Breasts: Examined lying and sitting.   Right: Without masses, retractions, nipple discharge or axillary adenopathy.   Left: Without masses, retractions, nipple discharge or axillary adenopathy. Pelvic:  External genitalia:  no lesions              Urethra:  normal appearing urethra with no masses, tenderness or lesions              Bartholins and Skenes: normal                 Vagina: normal appearing vagina with normal color and discharge, no lesions              Cervix: no lesions Bimanual Exam:  Uterus:  no masses or tenderness              Adnexa: no mass, fullness, tenderness              Rectovaginal: Deferred              Anus:  normal, no lesions  Arvell Birchwood, CMA present as chaperone.   Assessment/Plan:  53 y.o. G0 for breast and pelvic exam.   Encounter for breast and pelvic examination - Plan: Cytology - PAP( [facility]). Education provided on SBEs, importance of preventative screenings, current guidelines, high calcium diet, regular exercise, and multivitamin daily.  Labs with PCP.   Cervical cancer screening - Plan: Cytology - PAP( [facility]). Normal pap history.   Perimenopausal symptoms - Plan: estradiol  (VIVELLE -DOT) 0.05 MG/24HR patch twice weekly. Good management.   Dyspareunia in female - Plan: estradiol  (ESTRACE  VAGINAL) 0.1 MG/GM vaginal cream twice weekly. Will use nightly x 2 weeks, then decrease to twice weekly.   Encounter for surveillance of contraceptive pills - Plan: norethindrone  (MICRONOR ) 0.35 MG tablet daily. Amenorrheic with use.   Screening for breast cancer -  Normal mammogram history. Continue annual screenings. Normal breast exam today.  Screening for colon cancer - 2010 colonoscopy with recommendations to repeat at age 89. Recommend scheduling.   Return in about 1 year (around 06/23/2024) for Annual.      Andee Bamberger DNP, 3:34 PM 06/24/2023

## 2023-06-26 LAB — CYTOLOGY - PAP
Adequacy: ABSENT
Comment: NEGATIVE
Diagnosis: NEGATIVE
High risk HPV: NEGATIVE

## 2023-06-27 ENCOUNTER — Ambulatory Visit: Payer: Self-pay | Admitting: Radiology

## 2023-06-30 NOTE — Addendum Note (Signed)
 Addended byAntonio Klinefelter on: 06/30/2023 07:59 AM   Modules accepted: Level of Service

## 2023-07-30 ENCOUNTER — Telehealth: Payer: Self-pay

## 2023-07-30 NOTE — Telephone Encounter (Signed)
 Patient called & stated that she needed a refill for her vaginal cream & hrt patch. Upon review, refills were sent in for her medications so she should not need a new rx at this time. Patient is aware.

## 2023-07-31 ENCOUNTER — Ambulatory Visit: Admitting: Physical Therapy

## 2023-08-07 ENCOUNTER — Ambulatory Visit: Admitting: Physical Therapy

## 2023-08-12 ENCOUNTER — Ambulatory Visit

## 2023-08-14 ENCOUNTER — Encounter: Admitting: Physical Therapy

## 2023-08-21 ENCOUNTER — Encounter: Admitting: Physical Therapy

## 2023-08-26 ENCOUNTER — Other Ambulatory Visit: Payer: Self-pay

## 2023-08-26 ENCOUNTER — Ambulatory Visit: Attending: Nurse Practitioner

## 2023-08-26 DIAGNOSIS — M6281 Muscle weakness (generalized): Secondary | ICD-10-CM | POA: Insufficient documentation

## 2023-08-26 DIAGNOSIS — N3281 Overactive bladder: Secondary | ICD-10-CM | POA: Insufficient documentation

## 2023-08-26 NOTE — Patient Instructions (Signed)
 Patient Education  TOILET POSTURE: Urination: feet flat, lean forward with forearms on legs to fully empty bladder. Bowel movement: place feet flat on Squatty Potty or stool so knees are higher than hips, lean forward to relax pelvic floor in order to avoid strain.  SHOES: wear supportive shoes, and sandals with straps.  POSTURE: try not to cross legs at knees or ankles. Try the figure four stretch instead.  WATER: start with water first thing in the morning.   PELVIC TILTS: try to stand in neutral, not tucking your tail and not arching back, but in the middle.

## 2023-08-26 NOTE — Therapy (Signed)
 Pt reported she could not afford entire visit but wished for a screen to determine if she needs assistance for PFM dysfunction. Pt has OAB and nocturia 4-6x/night.  PT provided pt with PT educated pt on main functions of the pelvic floor, IAP, breath and PFM relationship. PT discussed POC, frequency and duration. PT provided the following education: TOILET POSTURE: Urination: feet flat, lean forward with forearms on legs to fully empty bladder. Bowel movement: place feet flat on Squatty Potty or stool so knees are higher than hips, lean forward to relax pelvic floor in order to avoid strain.  SHOES: wear supportive shoes, and sandals with straps.  POSTURE: try not to cross legs at knees or ankles. Try the figure four stretch instead.  WATER: start with water first thing in the morning.   PELVIC TILTS: try to stand in neutral, not tucking your tail and not arching back, but in the middle.  PT also provided pt with [facility] info for probono care.   Screen only, no charge.   Delon Pinal, PT,DPT 08/26/23 3:17 PM Phone: [phone] Fax: [phone]

## 2023-08-28 ENCOUNTER — Encounter

## 2023-08-28 ENCOUNTER — Encounter: Admitting: Physical Therapy

## 2023-09-02 ENCOUNTER — Encounter

## 2023-09-04 ENCOUNTER — Encounter: Admitting: Physical Therapy

## 2023-09-09 ENCOUNTER — Encounter

## 2023-09-11 ENCOUNTER — Encounter: Admitting: Physical Therapy

## 2023-09-17 ENCOUNTER — Other Ambulatory Visit: Payer: Self-pay

## 2023-09-17 DIAGNOSIS — N941 Unspecified dyspareunia: Secondary | ICD-10-CM

## 2023-09-17 MED ORDER — ESTRADIOL 0.1 MG/GM VA CREA: 1.0000 | TOPICAL_CREAM | VAGINAL | 2 refills | Status: AC

## 2023-09-17 NOTE — Telephone Encounter (Signed)
.  Med refill request: Estradiol  vaginal cream  Last AEX: 06/15/23 Next AEX:06/28/24 Last MMG (if hormonal med) 06/06/22- BI-RADS CATEGORY 1: Negative.  Refill authorized: Please Advise?   Pt states that she is using the medications as directed and she is running out.   Please advise. She states the pharmacy told her she isn't due for another prescription until October.

## 2023-09-17 NOTE — Telephone Encounter (Signed)
 LM making aware of the directions and that prescription was sent in.

## 2023-09-18 ENCOUNTER — Encounter: Admitting: Physical Therapy

## 2023-09-25 ENCOUNTER — Encounter: Admitting: Physical Therapy

## 2023-09-25 ENCOUNTER — Encounter

## 2023-10-02 ENCOUNTER — Encounter

## 2023-10-09 ENCOUNTER — Encounter

## 2023-10-16 ENCOUNTER — Encounter

## 2023-11-14 ENCOUNTER — Other Ambulatory Visit: Payer: Self-pay | Admitting: Medical Genetics

## 2023-11-14 DIAGNOSIS — Z006 Encounter for examination for normal comparison and control in clinical research program: Secondary | ICD-10-CM

## 2024-06-28 ENCOUNTER — Ambulatory Visit: Admitting: Nurse Practitioner
